# Patient Record
Sex: Male | Born: 1965 | Race: White | Hispanic: No | Marital: Single | Smoking: Never smoker
Health system: Southern US, Community
[De-identification: ages and names within clinical notes are randomized; demographics above are authoritative.]

## PROBLEM LIST (undated history)

## (undated) DIAGNOSIS — E039 Hypothyroidism, unspecified: Secondary | ICD-10-CM

## (undated) DIAGNOSIS — R030 Elevated blood-pressure reading, without diagnosis of hypertension: Secondary | ICD-10-CM

## (undated) DIAGNOSIS — E291 Testicular hypofunction: Secondary | ICD-10-CM

## (undated) HISTORY — PX: BREAST SURGERY: SHX581

## (undated) HISTORY — PX: COSMETIC SURGERY: SHX468

## (undated) HISTORY — PX: TONSILLECTOMY: SUR1361

## (undated) HISTORY — DX: Testicular hypofunction: E29.1

## (undated) HISTORY — DX: Elevated blood-pressure reading, without diagnosis of hypertension: R03.0

---

## 2002-09-04 ENCOUNTER — Emergency Department (HOSPITAL_COMMUNITY): Admission: EM | Admit: 2002-09-04 | Discharge: 2002-09-04 | Payer: Self-pay | Admitting: Emergency Medicine

## 2002-09-04 ENCOUNTER — Encounter: Payer: Self-pay | Admitting: Emergency Medicine

## 2007-04-21 ENCOUNTER — Ambulatory Visit (HOSPITAL_COMMUNITY): Admission: RE | Admit: 2007-04-21 | Discharge: 2007-04-21 | Payer: Self-pay | Admitting: Internal Medicine

## 2010-07-26 ENCOUNTER — Emergency Department (HOSPITAL_COMMUNITY)
Admission: EM | Admit: 2010-07-26 | Discharge: 2010-07-27 | Disposition: A | Discharge status: Home / Self Care | Payer: BC Managed Care – PPO | Attending: Emergency Medicine | Admitting: Emergency Medicine

## 2010-07-26 ENCOUNTER — Encounter: Payer: Self-pay | Admitting: *Deleted

## 2010-07-26 DIAGNOSIS — I10 Essential (primary) hypertension: Secondary | ICD-10-CM | POA: Insufficient documentation

## 2010-07-26 DIAGNOSIS — R21 Rash and other nonspecific skin eruption: Secondary | ICD-10-CM | POA: Insufficient documentation

## 2010-07-26 MED ORDER — DIPHENHYDRAMINE HCL 50 MG/ML IJ SOLN
50.0000 mg | Freq: Once | INTRAMUSCULAR | Status: AC
  Administered 2010-07-26: 50 mg via INTRAMUSCULAR
  Filled 2010-07-26: qty 1

## 2010-07-26 MED ORDER — DIPHENHYDRAMINE HCL 25 MG PO TABS: 50.0000 mg | ORAL_TABLET | Freq: Four times a day (QID) | ORAL | Status: AC

## 2010-07-26 NOTE — ED Provider Notes (Signed)
History     Chief Complaint  Patient presents with  . Rash   Patient is a 45 y.o. male presenting with rash.  Rash    Pt presents with c/o diffuse itchy rash.  Was seen at urgent care 2 days ago, started on steroid dose pack 2 days ago and has tried benadryl.  Rash has continued and remains itchy.  No lip/tongue swelling, no sob.  No known new exposures, except patient was pulling weeds before the rash began.  No new medications, no new foods, no new topical things such as detergent, soaps.  No hx of allergies, no fever Past Medical History  Diagnosis Date  . Hypertension     Past Surgical History  Procedure Date  . Tonsillectomy     No family history on file.  History  Substance Use Topics  . Smoking status: Never Smoker   . Smokeless tobacco: Not on file  . Alcohol Use: No      Review of Systems  Skin: Positive for rash.  10 Systems reviewed and are negative for acute change except as noted in the HPI.  Physical Exam  BP 157/107  Pulse 106  Temp(Src) 98.7 F (37.1 C) (Oral)  Resp 18  Ht 5\' 9"  (1.753 m)  Wt 210 lb (95.255 kg)  BMI 31.01 kg/m2  SpO2 98%  Physical Exam  Nursing note and vitals reviewed. Gen- awake, alert, NAD Eyes- PERRL, EOMI, normal appearance HEENT- MMM, OP clear, no lip/tongue swelling, palate symmetric Neck- supple, from Lungs- CTAB, breath sounds symmetric, no accessory muscle movement or dyspnea CV-RRR, no murmur/rub/gallop Abd- soft, nontender, nondistended Chest- nontender Ext- no peripheral edema, neurovascularly intact, 2+ dp pulses, brisk cap refill Skin- erythematous, hive appearance, blanching, over upper and lower extremities, chest abdomen, no involvement of face or eyes   ED Course  Procedures  MDM Pt with pruritic rash, no specific cause known.   Appears to be allergic in nature.  Contact dermatitis versus ingested allergen.  No fevers or other systemic symptoms to suggest infection.  Doubt drug rash as pt denies  taking any medications or any other OTC meds.   Pt advised to continue steroid as prescribed, also benadryl.  Given benadryl IM here to help with itching.       Ethelda Chick, MD 07/26/10 [phone]

## 2010-07-26 NOTE — ED Notes (Signed)
Patient with rash for couple of days, started off with couple of spots and now all over, seen at Urgent Care and prescribed with prednisone and without relief, c/o itching.

## 2010-07-26 NOTE — ED Notes (Signed)
Denies SOB.

## 2010-07-27 ENCOUNTER — Encounter (HOSPITAL_COMMUNITY): Payer: Self-pay | Admitting: *Deleted

## 2011-02-01 ENCOUNTER — Other Ambulatory Visit (HOSPITAL_COMMUNITY): Payer: Self-pay | Admitting: Internal Medicine

## 2011-02-01 ENCOUNTER — Ambulatory Visit (HOSPITAL_COMMUNITY)
Admission: RE | Admit: 2011-02-01 | Discharge: 2011-02-01 | Disposition: A | Discharge status: Home / Self Care | Payer: 59 | Source: Ambulatory Visit | Attending: Internal Medicine | Admitting: Internal Medicine

## 2011-02-01 DIAGNOSIS — M51379 Other intervertebral disc degeneration, lumbosacral region without mention of lumbar back pain or lower extremity pain: Secondary | ICD-10-CM | POA: Insufficient documentation

## 2011-02-01 DIAGNOSIS — M545 Low back pain, unspecified: Secondary | ICD-10-CM | POA: Insufficient documentation

## 2011-02-01 DIAGNOSIS — M8448XA Pathological fracture, other site, initial encounter for fracture: Secondary | ICD-10-CM | POA: Insufficient documentation

## 2011-02-01 DIAGNOSIS — M549 Dorsalgia, unspecified: Secondary | ICD-10-CM

## 2011-02-01 DIAGNOSIS — M5137 Other intervertebral disc degeneration, lumbosacral region: Secondary | ICD-10-CM | POA: Insufficient documentation

## 2014-05-07 ENCOUNTER — Other Ambulatory Visit (HOSPITAL_COMMUNITY): Payer: Self-pay | Admitting: Family Medicine

## 2014-05-07 DIAGNOSIS — E039 Hypothyroidism, unspecified: Secondary | ICD-10-CM

## 2014-05-11 ENCOUNTER — Ambulatory Visit (HOSPITAL_COMMUNITY)
Admission: RE | Admit: 2014-05-11 | Discharge: 2014-05-11 | Disposition: A | Discharge status: Home / Self Care | Payer: 59 | Source: Ambulatory Visit | Attending: Family Medicine | Admitting: Family Medicine

## 2014-05-11 DIAGNOSIS — E039 Hypothyroidism, unspecified: Secondary | ICD-10-CM | POA: Diagnosis not present

## 2014-08-24 ENCOUNTER — Other Ambulatory Visit (HOSPITAL_COMMUNITY): Payer: Self-pay | Admitting: Family Medicine

## 2014-08-24 DIAGNOSIS — M5410 Radiculopathy, site unspecified: Secondary | ICD-10-CM

## 2014-09-03 ENCOUNTER — Ambulatory Visit (HOSPITAL_COMMUNITY)
Admission: RE | Admit: 2014-09-03 | Discharge: 2014-09-03 | Disposition: A | Discharge status: Home / Self Care | Payer: 59 | Source: Ambulatory Visit | Attending: Family Medicine | Admitting: Family Medicine

## 2014-09-03 DIAGNOSIS — M5126 Other intervertebral disc displacement, lumbar region: Secondary | ICD-10-CM | POA: Diagnosis not present

## 2014-09-03 DIAGNOSIS — M5410 Radiculopathy, site unspecified: Secondary | ICD-10-CM

## 2014-09-03 DIAGNOSIS — M4806 Spinal stenosis, lumbar region: Secondary | ICD-10-CM | POA: Insufficient documentation

## 2014-09-03 DIAGNOSIS — M545 Low back pain: Secondary | ICD-10-CM | POA: Diagnosis not present

## 2014-10-07 ENCOUNTER — Other Ambulatory Visit: Payer: Self-pay | Admitting: Orthopedic Surgery

## 2014-10-07 DIAGNOSIS — M545 Low back pain, unspecified: Secondary | ICD-10-CM

## 2014-10-22 ENCOUNTER — Ambulatory Visit
Admission: RE | Admit: 2014-10-22 | Discharge: 2014-10-22 | Disposition: A | Discharge status: Home / Self Care | Payer: 59 | Source: Ambulatory Visit | Attending: Orthopedic Surgery | Admitting: Orthopedic Surgery

## 2014-10-22 DIAGNOSIS — M545 Low back pain, unspecified: Secondary | ICD-10-CM

## 2014-10-22 MED ORDER — DIAZEPAM 5 MG PO TABS
5.0000 mg | ORAL_TABLET | Freq: Once | ORAL | Status: AC
  Administered 2014-10-22: 5 mg via ORAL

## 2014-10-22 MED ORDER — IOHEXOL 180 MG/ML  SOLN
15.0000 mL | Freq: Once | INTRAMUSCULAR | Status: DC | PRN
  Administered 2014-10-22: 15 mL via INTRATHECAL

## 2014-10-22 NOTE — Discharge Instructions (Signed)

## 2014-11-02 ENCOUNTER — Other Ambulatory Visit: Payer: Self-pay | Admitting: Surgical

## 2014-11-08 NOTE — Patient Instructions (Signed)
Brian Guzman  11/08/2014   Your procedure is scheduled on:  11/17/14   Report to [facility] Main  Entrance take [place]  elevators to 3rd floor to  [place] at      [phone].  Call this number if you have problems the morning of surgery [phone]   Remember: ONLY 1 PERSON MAY GO WITH YOU TO SHORT STAY TO GET  READY MORNING OF Brian Guzman.  Do not eat food or drink liquids :After Midnight.     Take these medicines the morning of surgery with A SIP OF WATER: none                                 You may not have any metal on your body including hair pins and              piercings  Do not wear jewelry,, lotions, powders or perfumes, deodorant                       Men may shave face and neck.   Do not bring valuables to the hospital. [name].  Contacts, dentures or bridgework may not be worn into surgery.  Leave suitcase in the car. After surgery it may be brought to your room.       Special Instructions:  Coughing and deep breathing exercises, leg exercises               Please read over the following fact sheets you were given: _____________________________________________________________________             [facility] - Preparing for Surgery Before surgery, you can play an important role.  Because skin is not sterile, your skin needs to be as free of germs as possible.  You can reduce the number of germs on your skin by washing with CHG (chlorahexidine gluconate) soap before surgery.  CHG is an antiseptic cleaner which kills germs and bonds with the skin to continue killing germs even after washing. Please DO NOT use if you have an allergy to CHG or antibacterial soaps.  If your skin becomes reddened/irritated stop using the CHG and inform your nurse when you arrive at Short Stay. Do not shave (including legs and underarms) for at least 48 hours prior to the first CHG shower.  You may shave  your face/neck. Please follow these instructions carefully:  1.  Shower with CHG Soap the night before surgery and the  morning of Surgery.  2.  If you choose to wash your hair, wash your hair first as usual with your  normal  shampoo.  3.  After you shampoo, rinse your hair and body thoroughly to remove the  shampoo.                           4.  Use CHG as you would any other liquid soap.  You can apply chg directly  to the skin and wash                       Gently with a scrungie or clean washcloth.  5.  Apply the CHG  Soap to your body ONLY FROM THE NECK DOWN.   Do not use on face/ open                           Wound or open sores. Avoid contact with eyes, ears mouth and genitals (private parts).                       Wash face,  Genitals (private parts) with your normal soap.             6.  Wash thoroughly, paying special attention to the area where your surgery  will be performed.  7.  Thoroughly rinse your body with warm water from the neck down.  8.  DO NOT shower/wash with your normal soap after using and rinsing off  the CHG Soap.                9.  Pat yourself dry with a clean towel.            10.  Wear clean pajamas.            11.  Place clean sheets on your bed the night of your first shower and do not  sleep with pets. Day of Surgery : Do not apply any lotions/deodorants the morning of surgery.  Please wear clean clothes to the hospital/surgery center.  FAILURE TO FOLLOW THESE INSTRUCTIONS MAY RESULT IN THE CANCELLATION OF YOUR SURGERY PATIENT SIGNATURE_________________________________  NURSE SIGNATURE__________________________________  ________________________________________________________________________   Brian Guzman  An incentive spirometer is a tool that can help keep your lungs clear and active. This tool measures how well you are filling your lungs with each breath. Taking long deep breaths may help reverse or decrease the chance of developing  breathing (pulmonary) problems (especially infection) following:  A long period of time when you are unable to move or be active. BEFORE THE PROCEDURE   If the spirometer includes an indicator to show your best effort, your nurse or respiratory therapist will set it to a desired goal.  If possible, sit up straight or lean slightly forward. Try not to slouch.  Hold the incentive spirometer in an upright position. INSTRUCTIONS FOR USE  1. Sit on the edge of your bed if possible, or sit up as far as you can in bed or on a chair. 2. Hold the incentive spirometer in an upright position. 3. Breathe out normally. 4. Place the mouthpiece in your mouth and seal your lips tightly around it. 5. Breathe in slowly and as deeply as possible, raising the piston or the ball toward the top of the column. 6. Hold your breath for 3-5 seconds or for as long as possible. Allow the piston or ball to fall to the bottom of the column. 7. Remove the mouthpiece from your mouth and breathe out normally. 8. Rest for a few seconds and repeat Steps 1 through 7 at least 10 times every 1-2 hours when you are awake. Take your time and take a few normal breaths between deep breaths. 9. The spirometer may include an indicator to show your best effort. Use the indicator as a goal to work toward during each repetition. 10. After each set of 10 deep breaths, practice coughing to be sure your lungs are clear. If you have an incision (the cut made at the time of surgery), support your incision when coughing by placing a pillow or rolled up towels  firmly against it. Once you are able to get out of bed, walk around indoors and cough well. You may stop using the incentive spirometer when instructed by your caregiver.  RISKS AND COMPLICATIONS  Take your time so you do not get dizzy or light-headed.  If you are in pain, you may need to take or ask for pain medication before doing incentive spirometry. It is harder to take a deep  breath if you are having pain. AFTER USE  Rest and breathe slowly and easily.  It can be helpful to keep track of a log of your progress. Your caregiver can provide you with a simple table to help with this. If you are using the spirometer at home, follow these instructions: [place] IF:   You are having difficultly using the spirometer.  You have trouble using the spirometer as often as instructed.  Your pain medication is not giving enough relief while using the spirometer.  You develop fever of 100.5 F (38.1 C) or higher. SEEK IMMEDIATE MEDICAL CARE IF:   You cough up bloody sputum that had not been present before.  You develop fever of 102 F (38.9 C) or greater.  You develop worsening pain at or near the incision site. MAKE SURE YOU:   Understand these instructions.  Will watch your condition.  Will get help right away if you are not doing well or get worse. Document Released: 04/30/2006 Document Revised: 03/12/2011 Document Reviewed: 07/01/2006 [facility] Patient Information 2014 [place], [place].   ________________________________________________________________________

## 2014-11-10 ENCOUNTER — Ambulatory Visit (HOSPITAL_COMMUNITY)
Admission: RE | Admit: 2014-11-10 | Discharge: 2014-11-10 | Disposition: A | Discharge status: Home / Self Care | Payer: 59 | Source: Ambulatory Visit | Attending: Surgical | Admitting: Surgical

## 2014-11-10 ENCOUNTER — Encounter (HOSPITAL_COMMUNITY)
Admission: RE | Admit: 2014-11-10 | Discharge: 2014-11-10 | Disposition: A | Discharge status: Home / Self Care | Payer: 59 | Source: Ambulatory Visit | Attending: Orthopedic Surgery | Admitting: Orthopedic Surgery

## 2014-11-10 ENCOUNTER — Encounter (HOSPITAL_COMMUNITY): Payer: Self-pay

## 2014-11-10 DIAGNOSIS — M2578 Osteophyte, vertebrae: Secondary | ICD-10-CM | POA: Diagnosis not present

## 2014-11-10 DIAGNOSIS — Z01812 Encounter for preprocedural laboratory examination: Secondary | ICD-10-CM | POA: Diagnosis not present

## 2014-11-10 DIAGNOSIS — M5136 Other intervertebral disc degeneration, lumbar region: Secondary | ICD-10-CM | POA: Insufficient documentation

## 2014-11-10 DIAGNOSIS — Z01818 Encounter for other preprocedural examination: Secondary | ICD-10-CM | POA: Diagnosis present

## 2014-11-10 HISTORY — DX: Hypothyroidism, unspecified: E03.9

## 2014-11-10 LAB — COMPREHENSIVE METABOLIC PANEL
ALT: 39 U/L (ref 17–63)
AST: 24 U/L (ref 15–41)
Albumin: 4.5 g/dL (ref 3.5–5.0)
Alkaline Phosphatase: 63 U/L (ref 38–126)
Anion gap: 7 (ref 5–15)
BUN: 16 mg/dL (ref 6–20)
CO2: 27 mmol/L (ref 22–32)
Calcium: 9 mg/dL (ref 8.9–10.3)
Chloride: 103 mmol/L (ref 101–111)
Creatinine, Ser: 0.88 mg/dL (ref 0.61–1.24)
GFR calc Af Amer: >60 mL/min (ref >60)
GFR calc non Af Amer: >60 mL/min (ref >60)
Glucose, Bld: 97 mg/dL (ref 65–99)
Potassium: 3.7 mmol/L (ref 3.5–5.1)
Sodium: 137 mmol/L (ref 135–145)
Total Bilirubin: 0.9 mg/dL (ref 0.3–1.2)
Total Protein: 8 g/dL (ref 6.5–8.1)

## 2014-11-10 LAB — CBC WITH DIFFERENTIAL/PLATELET
Basophils Absolute: 0.1 10*3/uL (ref 0.0–0.1)
Basophils Relative: 1 %
Eosinophils Absolute: 0.3 10*3/uL (ref 0.0–0.7)
Eosinophils Relative: 4 %
HCT: 44.7 % (ref 39.0–52.0)
Hemoglobin: 15.6 g/dL (ref 13.0–17.0)
Lymphocytes Relative: 30 %
Lymphs Abs: 2.1 10*3/uL (ref 0.7–4.0)
MCH: 32 pg (ref 26.0–34.0)
MCHC: 34.9 g/dL (ref 30.0–36.0)
MCV: 91.6 fL (ref 78.0–100.0)
Monocytes Absolute: 0.9 10*3/uL (ref 0.1–1.0)
Monocytes Relative: 12 %
Neutro Abs: 3.8 10*3/uL (ref 1.7–7.7)
Neutrophils Relative %: 53 %
Platelets: 230 10*3/uL (ref 150–400)
RBC: 4.88 MIL/uL (ref 4.22–5.81)
RDW: 12.5 % (ref 11.5–15.5)
WBC: 7.2 10*3/uL (ref 4.0–10.5)

## 2014-11-10 LAB — PROTIME-INR
INR: 1.04 (ref 0.00–1.49)
Prothrombin Time: 13.8 seconds (ref 11.6–15.2)

## 2014-11-10 LAB — APTT: aPTT: 29 seconds (ref 24–37)

## 2014-11-10 LAB — SURGICAL PCR SCREEN
MRSA, PCR: NEGATIVE
Staphylococcus aureus: NEGATIVE

## 2014-11-15 NOTE — Progress Notes (Signed)
Final EKG in EPIC done 11/10/14.

## 2014-11-16 NOTE — H&P (Signed)
Brian Guzman is an 49 y.o. male.   Chief Complaint: low back pain HPI: The patient presented with the chief complaint of low back pain. He reports that he has a history of low back pain, but he has had increased pain in the past 6 months without any known injury. He has pain in both legs, right greater than left. No change in bladder or bowel function. He has been taking Meloxicam which gives him mild relief. He has pain at all times, even at rest. MRI showed bilateral foraminal disc herniation at L4-L5. CT myelogram showed significant stenosis at L4-L5.   Past Medical History  Diagnosis Date  . Hypertension   . Hypothyroidism     Past Surgical History  Procedure Laterality Date  . Tonsillectomy      Social History:  reports that he has never smoked. He has never used smokeless tobacco. He reports that he does not drink alcohol or use illicit drugs.  Allergies: No Known Allergies    Current outpatient prescriptions:  .  levothyroxine (SYNTHROID, LEVOTHROID) 50 MCG tablet, Take 50 mcg by mouth every evening., Disp: , Rfl: 3 .  losartan-hydrochlorothiazide (HYZAAR) 100-25 MG tablet, Take 1 tablet by mouth every evening. , Disp: , Rfl: 3 .  meloxicam (MOBIC) 7.5 MG tablet, Take 7.5 mg by mouth every evening. , Disp: , Rfl: 3   Review of Systems  Constitutional: Negative.   HENT: Negative.   Eyes: Negative.   Respiratory: Negative.   Cardiovascular: Negative.   Gastrointestinal: Negative.   Genitourinary: Negative.   Musculoskeletal: Positive for myalgias, back pain and joint pain. Negative for falls and neck pain.  Skin: Negative.   Neurological: Negative.   Endo/Heme/Allergies: Negative.   Psychiatric/Behavioral: Negative.     Vitals  Weight: 214 lb Height: 69in Body Surface Area: 2.13 m Body Mass Index: 31.6 kg/m  BP: 112/78 (Sitting, Left Arm, Standard) HR: 76 bpm  Physical Exam  Constitutional: He is oriented to person, place, and time. He appears  well-developed. No distress.  Overweight  HENT:  Head: Normocephalic and atraumatic.  Right Ear: External ear normal.  Left Ear: External ear normal.  Nose: Nose normal.  Mouth/Throat: Oropharynx is clear and moist.  Eyes: Conjunctivae and EOM are normal.  Neck: Normal range of motion. Neck supple.  Cardiovascular: Normal rate, regular rhythm, normal heart sounds and intact distal pulses.   No murmur heard. Respiratory: Effort normal and breath sounds normal. No respiratory distress. He has no wheezes.  GI: Soft. Bowel sounds are normal. He exhibits no distension. There is no tenderness.  Musculoskeletal:       Right hip: Normal.       Left hip: Normal.       Right knee: Normal.       Left knee: Normal.       Lumbar back: He exhibits pain and spasm.  Neurological: He is alert and oriented to person, place, and time. He has normal reflexes. No sensory deficit.  He has marked weakness of the dorsiflexors of his right foot.  Skin: No rash noted. He is not diaphoretic. No erythema.  Psychiatric: He has a normal mood and affect.     Assessment/Plan Lumbar spinal stenosis Lumbar disc herniation L4-L5 right He needs a central decompressive lumbar laminectomy and microdiscectomy L4-L5 right. Risks and benefits of the procedure discussed with the patient by Dr. Gladstone Lighter. The possible complications of spinal surgery number one could be infection, which is extremely rare. We do use antibiotics  prior to the surgery and during surgery and after surgery. Number two is always a slight degree of probability that you could develop a blood clot in your leg after any type of surgery and we try our best to prevent that with aspirin post op when it is safe to begin. The third is a dural leak. That is the spinal fluid leak that could occur. At certain rare times the bone or the disc could literally stick to the dura which is the lining which contains the spinal fluid and we could develop a small tear in that  lining which we then patch up. That is an extremely rare complication. The last and final complication is a recurrent disc rupture. That means that you could rupture another small piece of disc later on down the road and there is about a 2% chance of that.    H&P performed by Dr. Gladstone Lighter Documented by Ardeen Jourdain, PA-C  [place], Ryhanna [place]uns[place] Ander Purpura 11/16/2014, 8:38 AM

## 2014-11-17 ENCOUNTER — Ambulatory Visit (HOSPITAL_COMMUNITY): Payer: 59

## 2014-11-17 ENCOUNTER — Observation Stay (HOSPITAL_COMMUNITY)
Admission: RE | Admit: 2014-11-17 | Discharge: 2014-11-18 | Disposition: A | Discharge status: Home / Self Care | Payer: 59 | Source: Ambulatory Visit | Attending: Orthopedic Surgery | Admitting: Orthopedic Surgery

## 2014-11-17 ENCOUNTER — Ambulatory Visit (HOSPITAL_COMMUNITY): Payer: 59 | Admitting: Anesthesiology

## 2014-11-17 ENCOUNTER — Encounter (HOSPITAL_COMMUNITY): Payer: Self-pay | Admitting: *Deleted

## 2014-11-17 ENCOUNTER — Encounter (HOSPITAL_COMMUNITY)
Admission: RE | Disposition: A | Discharge status: Home / Self Care | Payer: Self-pay | Source: Ambulatory Visit | Attending: Orthopedic Surgery

## 2014-11-17 DIAGNOSIS — E669 Obesity, unspecified: Secondary | ICD-10-CM | POA: Insufficient documentation

## 2014-11-17 DIAGNOSIS — M4806 Spinal stenosis, lumbar region: Secondary | ICD-10-CM | POA: Insufficient documentation

## 2014-11-17 DIAGNOSIS — M5126 Other intervertebral disc displacement, lumbar region: Secondary | ICD-10-CM | POA: Diagnosis present

## 2014-11-17 DIAGNOSIS — Z6832 Body mass index (BMI) 32.0-32.9, adult: Secondary | ICD-10-CM | POA: Diagnosis not present

## 2014-11-17 DIAGNOSIS — Z79899 Other long term (current) drug therapy: Secondary | ICD-10-CM | POA: Insufficient documentation

## 2014-11-17 DIAGNOSIS — Z419 Encounter for procedure for purposes other than remedying health state, unspecified: Secondary | ICD-10-CM

## 2014-11-17 DIAGNOSIS — M48062 Spinal stenosis, lumbar region with neurogenic claudication: Secondary | ICD-10-CM | POA: Diagnosis present

## 2014-11-17 DIAGNOSIS — I10 Essential (primary) hypertension: Secondary | ICD-10-CM | POA: Diagnosis not present

## 2014-11-17 DIAGNOSIS — E039 Hypothyroidism, unspecified: Secondary | ICD-10-CM | POA: Diagnosis not present

## 2014-11-17 HISTORY — PX: LUMBAR LAMINECTOMY/DECOMPRESSION MICRODISCECTOMY: SHX5026

## 2014-11-17 SURGERY — LUMBAR LAMINECTOMY/DECOMPRESSION MICRODISCECTOMY 1 LEVEL
Anesthesia: General | Site: Back | Laterality: Right

## 2014-11-17 MED ORDER — PHENOL 1.4 % MT LIQD: 1.0000 | OROMUCOSAL | Status: DC | PRN

## 2014-11-17 MED ORDER — THROMBIN 5000 UNITS EX SOLR
CUTANEOUS | Status: DC | PRN
  Administered 2014-11-17: 10 mL via TOPICAL

## 2014-11-17 MED ORDER — ACETAMINOPHEN 650 MG RE SUPP: 650.0000 mg | RECTAL | Status: DC | PRN

## 2014-11-17 MED ORDER — SUGAMMADEX SODIUM 500 MG/5ML IV SOLN
INTRAVENOUS | Status: AC
  Filled 2014-11-17: qty 5

## 2014-11-17 MED ORDER — PROPOFOL 10 MG/ML IV BOLUS
INTRAVENOUS | Status: AC
  Filled 2014-11-17: qty 20

## 2014-11-17 MED ORDER — ROCURONIUM BROMIDE 100 MG/10ML IV SOLN
INTRAVENOUS | Status: DC | PRN
  Administered 2014-11-17: 10 mg via INTRAVENOUS
  Administered 2014-11-17: 50 mg via INTRAVENOUS
  Administered 2014-11-17 (×2): 10 mg via INTRAVENOUS

## 2014-11-17 MED ORDER — BACITRACIN-NEOMYCIN-POLYMYXIN 400-5-5000 EX OINT
TOPICAL_OINTMENT | CUTANEOUS | Status: AC
  Filled 2014-11-17: qty 1

## 2014-11-17 MED ORDER — METHOCARBAMOL 500 MG PO TABS
500.0000 mg | ORAL_TABLET | Freq: Four times a day (QID) | ORAL | Status: DC | PRN
  Administered 2014-11-17: 500 mg via ORAL
  Filled 2014-11-17 (×2): qty 1

## 2014-11-17 MED ORDER — BUPIVACAINE LIPOSOME 1.3 % IJ SUSP
20.0000 mL | Freq: Once | INTRAMUSCULAR | Status: AC
  Administered 2014-11-17: 20 mL
  Filled 2014-11-17: qty 20

## 2014-11-17 MED ORDER — ONDANSETRON HCL 4 MG/2ML IJ SOLN
INTRAMUSCULAR | Status: AC
  Filled 2014-11-17: qty 2

## 2014-11-17 MED ORDER — HYDROCHLOROTHIAZIDE 25 MG PO TABS
25.0000 mg | ORAL_TABLET | Freq: Every day | ORAL | Status: DC
  Administered 2014-11-18: 25 mg via ORAL
  Filled 2014-11-17 (×2): qty 1

## 2014-11-17 MED ORDER — MIDAZOLAM HCL 2 MG/2ML IJ SOLN
INTRAMUSCULAR | Status: AC
  Filled 2014-11-17: qty 2

## 2014-11-17 MED ORDER — CHLORHEXIDINE GLUCONATE 4 % EX LIQD: 60.0000 mL | Freq: Once | CUTANEOUS | Status: DC

## 2014-11-17 MED ORDER — HYDROMORPHONE HCL 1 MG/ML IJ SOLN: 0.5000 mg | INTRAMUSCULAR | Status: DC | PRN

## 2014-11-17 MED ORDER — DEXAMETHASONE SODIUM PHOSPHATE 10 MG/ML IJ SOLN
INTRAMUSCULAR | Status: AC
  Filled 2014-11-17: qty 1

## 2014-11-17 MED ORDER — BISACODYL 5 MG PO TBEC: 5.0000 mg | DELAYED_RELEASE_TABLET | Freq: Every day | ORAL | Status: DC | PRN

## 2014-11-17 MED ORDER — BACITRACIN ZINC 500 UNIT/GM EX OINT
TOPICAL_OINTMENT | CUTANEOUS | Status: AC
  Filled 2014-11-17: qty 28.35

## 2014-11-17 MED ORDER — CEFAZOLIN SODIUM-DEXTROSE 2-3 GM-% IV SOLR
INTRAVENOUS | Status: AC
  Filled 2014-11-17: qty 50

## 2014-11-17 MED ORDER — MIDAZOLAM HCL 5 MG/5ML IJ SOLN
INTRAMUSCULAR | Status: DC | PRN
  Administered 2014-11-17: 2 mg via INTRAVENOUS

## 2014-11-17 MED ORDER — LACTATED RINGERS IV SOLN
INTRAVENOUS | Status: DC
  Administered 2014-11-17: 20 mL/h via INTRAVENOUS

## 2014-11-17 MED ORDER — BACITRACIN-NEOMYCIN-POLYMYXIN 400-5-5000 EX OINT
TOPICAL_OINTMENT | CUTANEOUS | Status: DC | PRN
  Administered 2014-11-17: 1 via TOPICAL

## 2014-11-17 MED ORDER — SODIUM CHLORIDE 0.9 % IR SOLN
Status: DC | PRN
  Administered 2014-11-17: 500 mL

## 2014-11-17 MED ORDER — LABETALOL HCL 5 MG/ML IV SOLN
INTRAVENOUS | Status: DC | PRN
  Administered 2014-11-17 (×4): 2.5 mg via INTRAVENOUS

## 2014-11-17 MED ORDER — HYDROMORPHONE HCL 1 MG/ML IJ SOLN
0.2500 mg | INTRAMUSCULAR | Status: DC | PRN
  Administered 2014-11-17 (×2): 0.5 mg via INTRAVENOUS

## 2014-11-17 MED ORDER — POLYETHYLENE GLYCOL 3350 17 G PO PACK: 17.0000 g | PACK | Freq: Every day | ORAL | Status: DC | PRN

## 2014-11-17 MED ORDER — PROMETHAZINE HCL 25 MG/ML IJ SOLN: 6.2500 mg | INTRAMUSCULAR | Status: DC | PRN

## 2014-11-17 MED ORDER — LACTATED RINGERS IV SOLN
INTRAVENOUS | Status: DC
  Administered 2014-11-17 (×2): via INTRAVENOUS

## 2014-11-17 MED ORDER — LIDOCAINE HCL (CARDIAC) 20 MG/ML IV SOLN
INTRAVENOUS | Status: DC | PRN
  Administered 2014-11-17: 50 mg via INTRAVENOUS

## 2014-11-17 MED ORDER — LABETALOL HCL 5 MG/ML IV SOLN
INTRAVENOUS | Status: AC
  Filled 2014-11-17: qty 4

## 2014-11-17 MED ORDER — HYDROMORPHONE HCL 1 MG/ML IJ SOLN
INTRAMUSCULAR | Status: DC | PRN
  Administered 2014-11-17: 0.5 mg via INTRAVENOUS
  Administered 2014-11-17: 1 mg via INTRAVENOUS
  Administered 2014-11-17: 0.5 mg via INTRAVENOUS

## 2014-11-17 MED ORDER — OXYCODONE-ACETAMINOPHEN 5-325 MG PO TABS
1.0000 | ORAL_TABLET | ORAL | Status: DC | PRN
  Administered 2014-11-17: 1 via ORAL
  Administered 2014-11-18 (×3): 2 via ORAL
  Filled 2014-11-17: qty 2
  Filled 2014-11-17: qty 1
  Filled 2014-11-17 (×2): qty 2

## 2014-11-17 MED ORDER — HYDROCODONE-ACETAMINOPHEN 5-325 MG PO TABS
1.0000 | ORAL_TABLET | ORAL | Status: DC | PRN
  Filled 2014-11-17: qty 1

## 2014-11-17 MED ORDER — ACETAMINOPHEN 325 MG PO TABS: 650.0000 mg | ORAL_TABLET | ORAL | Status: DC | PRN

## 2014-11-17 MED ORDER — SUGAMMADEX SODIUM 500 MG/5ML IV SOLN
INTRAVENOUS | Status: DC | PRN
  Administered 2014-11-17: 500 mg via INTRAVENOUS

## 2014-11-17 MED ORDER — FENTANYL CITRATE (PF) 250 MCG/5ML IJ SOLN
INTRAMUSCULAR | Status: AC
  Filled 2014-11-17: qty 5

## 2014-11-17 MED ORDER — METHOCARBAMOL 1000 MG/10ML IJ SOLN
500.0000 mg | Freq: Four times a day (QID) | INTRAVENOUS | Status: DC | PRN
  Administered 2014-11-17: 500 mg via INTRAVENOUS
  Filled 2014-11-17 (×2): qty 5

## 2014-11-17 MED ORDER — BUPIVACAINE-EPINEPHRINE 0.5% -1:200000 IJ SOLN
INTRAMUSCULAR | Status: DC | PRN
  Administered 2014-11-17: 20 mL

## 2014-11-17 MED ORDER — LOSARTAN POTASSIUM-HCTZ 100-25 MG PO TABS: 1.0000 | ORAL_TABLET | Freq: Every evening | ORAL | Status: DC

## 2014-11-17 MED ORDER — SODIUM CHLORIDE 0.9 % IR SOLN
Status: AC
  Filled 2014-11-17: qty 1

## 2014-11-17 MED ORDER — FENTANYL CITRATE (PF) 100 MCG/2ML IJ SOLN
INTRAMUSCULAR | Status: DC | PRN
  Administered 2014-11-17: 50 ug via INTRAVENOUS
  Administered 2014-11-17 (×2): 100 ug via INTRAVENOUS

## 2014-11-17 MED ORDER — ONDANSETRON HCL 4 MG/2ML IJ SOLN: 4.0000 mg | INTRAMUSCULAR | Status: DC | PRN

## 2014-11-17 MED ORDER — THROMBIN 5000 UNITS EX SOLR
CUTANEOUS | Status: AC
  Filled 2014-11-17: qty 5000

## 2014-11-17 MED ORDER — FLEET ENEMA 7-19 GM/118ML RE ENEM: 1.0000 | ENEMA | Freq: Once | RECTAL | Status: DC | PRN

## 2014-11-17 MED ORDER — HYDROMORPHONE HCL 2 MG/ML IJ SOLN
INTRAMUSCULAR | Status: AC
  Filled 2014-11-17: qty 1

## 2014-11-17 MED ORDER — ROCURONIUM BROMIDE 100 MG/10ML IV SOLN
INTRAVENOUS | Status: AC
  Filled 2014-11-17: qty 2

## 2014-11-17 MED ORDER — BUPIVACAINE-EPINEPHRINE (PF) 0.5% -1:200000 IJ SOLN
INTRAMUSCULAR | Status: AC
  Filled 2014-11-17: qty 30

## 2014-11-17 MED ORDER — PROPOFOL 10 MG/ML IV BOLUS
INTRAVENOUS | Status: AC
  Filled 2014-11-17: qty 40

## 2014-11-17 MED ORDER — LIDOCAINE HCL (CARDIAC) 20 MG/ML IV SOLN
INTRAVENOUS | Status: AC
  Filled 2014-11-17: qty 5

## 2014-11-17 MED ORDER — DEXAMETHASONE SODIUM PHOSPHATE 10 MG/ML IJ SOLN
INTRAMUSCULAR | Status: DC | PRN
  Administered 2014-11-17: 10 mg via INTRAVENOUS

## 2014-11-17 MED ORDER — CEFAZOLIN SODIUM-DEXTROSE 2-3 GM-% IV SOLR
2.0000 g | INTRAVENOUS | Status: AC
  Administered 2014-11-17: 2 g via INTRAVENOUS

## 2014-11-17 MED ORDER — LOSARTAN POTASSIUM 50 MG PO TABS
100.0000 mg | ORAL_TABLET | Freq: Every day | ORAL | Status: DC
  Administered 2014-11-18: 100 mg via ORAL
  Filled 2014-11-17 (×2): qty 2

## 2014-11-17 MED ORDER — CEFAZOLIN SODIUM 1-5 GM-% IV SOLN
1.0000 g | Freq: Three times a day (TID) | INTRAVENOUS | Status: AC
  Administered 2014-11-17 – 2014-11-18 (×3): 1 g via INTRAVENOUS
  Filled 2014-11-17 (×3): qty 50

## 2014-11-17 MED ORDER — ONDANSETRON HCL 4 MG/2ML IJ SOLN
INTRAMUSCULAR | Status: DC | PRN
  Administered 2014-11-17: 4 mg via INTRAVENOUS

## 2014-11-17 MED ORDER — PROPOFOL 10 MG/ML IV BOLUS
INTRAVENOUS | Status: DC | PRN
  Administered 2014-11-17: 200 mg via INTRAVENOUS

## 2014-11-17 MED ORDER — HYDROMORPHONE HCL 1 MG/ML IJ SOLN
INTRAMUSCULAR | Status: AC
  Filled 2014-11-17: qty 1

## 2014-11-17 MED ORDER — MENTHOL 3 MG MT LOZG: 1.0000 | LOZENGE | OROMUCOSAL | Status: DC | PRN

## 2014-11-17 MED ORDER — LEVOTHYROXINE SODIUM 50 MCG PO TABS
50.0000 ug | ORAL_TABLET | Freq: Every day | ORAL | Status: DC
  Administered 2014-11-17: 50 ug via ORAL
  Filled 2014-11-17 (×2): qty 1

## 2014-11-17 SURGICAL SUPPLY — 38 items
BAG ZIPLOCK 12X15 (MISCELLANEOUS) ×2 IMPLANT
CLEANER TIP ELECTROSURG 2X2 (MISCELLANEOUS) ×2 IMPLANT
DRAPE MICROSCOPE LEICA (MISCELLANEOUS) ×2 IMPLANT
DRAPE POUCH INSTRU U-SHP 10X18 (DRAPES) ×2 IMPLANT
DRAPE SHEET LG 3/4 BI-LAMINATE (DRAPES) ×2 IMPLANT
DRAPE SURG 17X11 SM STRL (DRAPES) IMPLANT
DRSG ADAPTIC 3X8 NADH LF (GAUZE/BANDAGES/DRESSINGS) ×2 IMPLANT
DRSG PAD ABDOMINAL 8X10 ST (GAUZE/BANDAGES/DRESSINGS) IMPLANT
DURAPREP 26ML APPLICATOR (WOUND CARE) ×2 IMPLANT
ELECT BLADE TIP CTD 4 INCH (ELECTRODE) ×2 IMPLANT
ELECT REM PT RETURN 9FT ADLT (ELECTROSURGICAL) ×2
ELECTRODE REM PT RTRN 9FT ADLT (ELECTROSURGICAL) ×1 IMPLANT
GAUZE SPONGE 4X4 12PLY STRL (GAUZE/BANDAGES/DRESSINGS) ×2 IMPLANT
GLOVE BIOGEL PI IND STRL 8 (GLOVE) ×1 IMPLANT
GLOVE BIOGEL PI INDICATOR 8 (GLOVE) ×1
GLOVE ECLIPSE 8.0 STRL XLNG CF (GLOVE) ×2 IMPLANT
GOWN STRL REUS W/TWL XL LVL3 (GOWN DISPOSABLE) ×4 IMPLANT
KIT BASIN OR (CUSTOM PROCEDURE TRAY) ×2 IMPLANT
KIT POSITIONING SURG ANDREWS (MISCELLANEOUS) ×2 IMPLANT
MANIFOLD NEPTUNE II (INSTRUMENTS) ×2 IMPLANT
NEEDLE HYPO 22GX1.5 SAFETY (NEEDLE) ×2 IMPLANT
NEEDLE SPNL 18GX3.5 QUINCKE PK (NEEDLE) ×4 IMPLANT
PACK LAMINECTOMY ORTHO (CUSTOM PROCEDURE TRAY) ×2 IMPLANT
PAD ABD 8X10 STRL (GAUZE/BANDAGES/DRESSINGS) ×2 IMPLANT
PATTIES SURGICAL .5 X.5 (GAUZE/BANDAGES/DRESSINGS) ×2 IMPLANT
PATTIES SURGICAL .75X.75 (GAUZE/BANDAGES/DRESSINGS) IMPLANT
PATTIES SURGICAL 1X1 (DISPOSABLE) IMPLANT
PEN SKIN MARKING BROAD (MISCELLANEOUS) ×2 IMPLANT
SPONGE LAP 4X18 X RAY DECT (DISPOSABLE) IMPLANT
SPONGE SURGIFOAM ABS GEL 100 (HEMOSTASIS) ×2 IMPLANT
STAPLER VISISTAT 35W (STAPLE) ×2 IMPLANT
SUT VIC AB 0 CT1 27 (SUTURE) ×1
SUT VIC AB 0 CT1 27XBRD ANTBC (SUTURE) ×1 IMPLANT
SUT VIC AB 1 CT1 27 (SUTURE) ×3
SUT VIC AB 1 CT1 27XBRD ANTBC (SUTURE) ×3 IMPLANT
SYR 20CC LL (SYRINGE) ×2 IMPLANT
TAPE CLOTH SURG 6X10 WHT LF (GAUZE/BANDAGES/DRESSINGS) ×2 IMPLANT
TOWEL OR 17X26 10 PK STRL BLUE (TOWEL DISPOSABLE) ×2 IMPLANT

## 2014-11-17 NOTE — Anesthesia Procedure Notes (Signed)
Procedure Name: Intubation Date/Time: 11/17/2014 11:32 AM Performed by: Adrain Nesbit, Virgel Gess Pre-anesthesia Checklist: Patient identified, Emergency Drugs available, Suction available, Patient being monitored and Timeout performed Patient Re-evaluated:Patient Re-evaluated prior to inductionOxygen Delivery Method: Circle system utilized Preoxygenation: Pre-oxygenation with 100% oxygen Intubation Type: IV induction Ventilation: Mask ventilation without difficulty Laryngoscope Size: Mac and 4 Grade View: Grade II Tube type: Oral Tube size: 7.5 mm Number of attempts: 1 Airway Equipment and Method: Stylet Placement Confirmation: ETT inserted through vocal cords under direct vision,  positive ETCO2,  CO2 detector and breath sounds checked- equal and bilateral Secured at: 22 cm Tube secured with: Tape Dental Injury: Teeth and Oropharynx as per pre-operative assessment

## 2014-11-17 NOTE — Interval H&P Note (Signed)
History and Physical Interval Note:  11/17/2014 10:53 AM  Brian Guzman  has presented today for surgery, with the diagnosis of SPINAL STENOSIS  The various methods of treatment have been discussed with the patient and family. After consideration of risks, benefits and other options for treatment, the patient has consented to  Procedure(s): LUMBAR LAMINECTOMY/DECOMPRESSION MICRODISCECTOMY 1 LEVEL L4-5 ON RIGHT (Right) as a surgical intervention .  The patient's history has been reviewed, patient examined, no change in status, stable for surgery.  I have reviewed the patient's chart and labs.  Questions were answered to the patient's satisfaction.     Kischa Altice A

## 2014-11-17 NOTE — Brief Op Note (Signed)
11/17/2014  1:41 PM  PATIENT:  Brian Guzman  49 y.o. male  PRE-OPERATIVE DIAGNOSIS:  SPINAL STENOSIS at L-4-L-5 and HNP at L-4-L-5 on the Right and partial Foot Drop on the right.  POST-OPERATIVE DIAGNOSIS:  SPINAL STENOSIS L4-L5, HERNIATED DISC L4-L5 ON THE RIGHT, PARTIAL FOOT DROP ON THE RIGHT  PROCEDURE:  Procedure(s): CENTRAL DECOMPRESSIVE LUMBAR LAMINECTOMY AT L4-L5.  MICRODISCECTOMY AT L4-L5 ON RIGHT.  FORAMINOTOMY FOR THE L4 AND L5 NERVE ROOT ON THE RIGHT. (Right)  SURGEON:  Surgeon(s) and Role:    * Latanya Maudlin, MD - Primary    * Magnus Sinning, MD - Assisting  :   ASSISTANTS: Tarri Glenn MD  ANESTHESIA:   general  EBL:  Total I/O In: 1000 [I.V.:1000] Out: 100 [Blood:100]  BLOOD ADMINISTERED:none  DRAINS: none   LOCAL MEDICATIONS USED:  MARCAINE 20cc of 0.50% with Epinephrine at the Start of the Case and 20cc of Exparel at the end of the case    SPECIMEN:  Source of Specimen:  L-4-L-5 on the Right.  DISPOSITION OF SPECIMEN:  PATHOLOGY  COUNTS:  YES  TOURNIQUET:  * No tourniquets in log *  DICTATION: .Other Dictation: Dictation Number [id number]  PLAN OF CARE: Admit for overnight observation  PATIENT DISPOSITION:  Stable in OR   Delay start of Pharmacological VTE agent (>24hrs) due to surgical blood loss or risk of bleeding: yes

## 2014-11-17 NOTE — Anesthesia Postprocedure Evaluation (Signed)
  Anesthesia Post-op Note  Patient: Brian Guzman  Procedure(s) Performed: Procedure(s) (LRB): CENTRAL DECOMPRESSIVE LUMBAR LAMINECTOMY AT L4-L5.  MICRODISCECTOMY AT L4-L5 ON RIGHT.  FORAMINOTOMY FOR THE L4 AND L5 NERVE ROOT ON THE RIGHT. (Right)  Patient Location: PACU  Anesthesia Type: General  Level of Consciousness: awake and alert   Airway and Oxygen Therapy: Patient Spontanous Breathing  Post-op Pain: mild  Post-op Assessment: Post-op Vital signs reviewed, Patient's Cardiovascular Status Stable, Respiratory Function Stable, Patent Airway and No signs of Nausea or vomiting  Last Vitals:  Filed Vitals:   11/17/14 1709  BP: 116/73  Pulse: 109  Temp: 36.6 C  Resp: 18    Post-op Vital Signs: stable   Complications: No apparent anesthesia complications

## 2014-11-17 NOTE — Transfer of Care (Signed)
Immediate Anesthesia Transfer of Care Note  Patient: Brian Guzman  Procedure(s) Performed: Procedure(s): CENTRAL DECOMPRESSIVE LUMBAR LAMINECTOMY AT L4-L5.  MICRODISCECTOMY AT L4-L5 ON RIGHT.  FORAMINOTOMY FOR THE L4 AND L5 NERVE ROOT ON THE RIGHT. (Right)  Patient Location: PACU  Anesthesia Type:General  Level of Consciousness:  sedated, patient cooperative and responds to stimulation  Airway & Oxygen Therapy:Patient Spontanous Breathing and Patient connected to face mask oxgen  Post-op Assessment:  Report given to PACU RN and Post -op Vital signs reviewed and stable  Post vital signs:  Reviewed and stable  Last Vitals:  Filed Vitals:   11/17/14 0854  BP: 126/91  Pulse: 102  Temp: 36.9 C  Resp: 18    Complications: No apparent anesthesia complications

## 2014-11-17 NOTE — Anesthesia Preprocedure Evaluation (Signed)
Anesthesia Evaluation  Patient identified by MRN, date of birth, ID band Patient awake    Reviewed: Allergy & Precautions, NPO status , Patient's Chart, lab work & pertinent test results  Airway Mallampati: II  TM Distance: >3 FB Neck ROM: Full    Dental no notable dental hx.    Pulmonary neg pulmonary ROS,    Pulmonary exam normal breath sounds clear to auscultation       Cardiovascular Exercise Tolerance: Good hypertension, Pt. on medications Normal cardiovascular exam Rhythm:Regular Rate:Normal     Neuro/Psych negative neurological ROS  negative psych ROS   GI/Hepatic negative GI ROS, Neg liver ROS,   Endo/Other  Hypothyroidism   Renal/GU negative Renal ROS  negative genitourinary   Musculoskeletal negative musculoskeletal ROS (+)   Abdominal (+) + obese,   Peds negative pediatric ROS (+)  Hematology negative hematology ROS (+)   Anesthesia Other Findings   Reproductive/Obstetrics negative OB ROS                             Anesthesia Physical Anesthesia Plan  ASA: II  Anesthesia Plan: General   Post-op Pain Management:    Induction: Intravenous  Airway Management Planned: Oral ETT  Additional Equipment:   Intra-op Plan:   Post-operative Plan: Extubation in OR  Informed Consent: I have reviewed the patients History and Physical, chart, labs and discussed the procedure including the risks, benefits and alternatives for the proposed anesthesia with the patient or authorized representative who has indicated his/her understanding and acceptance.   Dental advisory given  Plan Discussed with: CRNA  Anesthesia Plan Comments:         Anesthesia Quick Evaluation

## 2014-11-18 DIAGNOSIS — M5126 Other intervertebral disc displacement, lumbar region: Secondary | ICD-10-CM | POA: Diagnosis not present

## 2014-11-18 MED ORDER — OXYCODONE-ACETAMINOPHEN 5-325 MG PO TABS: 1.0000 | ORAL_TABLET | ORAL | Status: DC | PRN

## 2014-11-18 MED ORDER — METHOCARBAMOL 500 MG PO TABS: 500.0000 mg | ORAL_TABLET | Freq: Four times a day (QID) | ORAL | Status: DC | PRN

## 2014-11-18 NOTE — Discharge Instructions (Addendum)
For the first three days, remove your dressing, tape a piece of saran wrap over your incision.  Take your shower, then remove the saran wrap and put a clean dressing on. After three days you can shower without the saran wrap.  Change dressing daily with gauze and paper tape. No lifting or driving.  Take aspirin 325mg  twice daily to prevent blood clots. Call Dr. Gladstone Lighter if any wound complications or temperature of 101 degrees F or over.  Call the office for an appointment to see Dr. Gladstone Lighter in two weeks: [phone] and ask for Dr. Charlestine Night nurse, Brunilda Payor.

## 2014-11-18 NOTE — Op Note (Signed)
NAME:  BLAIKE, PADUANO NO.:  [id number]  MEDICAL RECORD NO.:  [id number]  LOCATION:  [id number]                         FACILITY:  [facility]  PHYSICIAN:  Kipp Brood. Felis Quillin, M.D.DATE OF BIRTH:  [birth year]  DATE OF PROCEDURE:  11/17/2014 DATE OF DISCHARGE:                              OPERATIVE REPORT   SURGEON:  Kipp Brood. Gladstone Lighter, M.D.  ASSISTANT:  Tarri Glenn, M.D.  PREOPERATIVE DIAGNOSES: 1. 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 2. Foraminal stenosis of the L5 root on the right. 3. Herniated lumbar disk at L4-5 on the right. 4. Foraminal stenosis L4-5 on the right.  Note, he had bilateral leg     pain, but he had weakness of his right foot dorsiflexors.  POSTOPERATIVE DIAGNOSES: 1. 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 2. Foraminal stenosis of the L5 root on the right. 3. Herniated lumbar disk at L4-5 on the right. 4. Foraminal stenosis L4-5 on the right.  Note, he had bilateral leg     pain, but he had weakness of his right foot dorsiflexors.  OPERATION: 1. Central decompressive lumbar laminectomy at L4-L5. 2. Microdiskectomy at L4-5 on the right with the specimen sent to     Pathology. 3. Foraminotomy for the L4 root on the right. 4. Foraminotomy for the L5 root on the right.  DESCRIPTION OF PROCEDURE:  Under general anesthesia, routine orthopedic prep and draping of the lower back was carried out.  At this time, after the appropriate time-out was carried out, then I also marked the appropriate right side of his back in the holding area even though we had to go central.  At this time, 2 needles were placed in the back for localization purposes.  X-ray was taken to verify the position. Incision then was made over the L4-5 space and extended proximally and distally.  We then went down, stripped the muscle from the lamina and spinous process.  Note, prior to making the incision, I injected 20 mL of 0.5% Marcaine with epinephrine into the soft  tissue to cut down the bleeding.  After we then stripped the muscle from the lamina and spinous process, another x-ray was taken with a Kocher clamp in place. Following that, we then went down, did our central decompression at L4- 5.  The microscope was brought in.  We extended the decompression proximally and distally until we had a good thorough opening of the canal.  After doing this, we then protected the dura, we removed the ligamentum flavum.  We went out far laterally and decompressed the lateral recess on the right.  It was extremely tight.  We isolated the nerve root very easily.  We identified the disk.  Prior to doing this, another x-ray was taken to verify the 4-5 position.  We went up a little further north because of the foraminal nature of the disk as well.  At this particular time, we did a foraminotomy for the 4 and 5 roots on the right.  We then gently retracted the dura with D'Errico and inserted a spinal needle to make sure we were in the space and we were.  We then made a cruciate incision  in the posterior longitudinal ligament and a microdiskectomy was carried out in the usual fashion.  I utilized the nerve hook and the Epstein curettes to curette out the material, went down into the space with the pituitary rongeurs, and then decompressed out in the foramen at 4-5 with the Epstein curette to make sure there were no other fragments.  After we completed dissection, the dura was easily movable.  The L5 root was easily movable.  We were able to easily pass a hockey-stick out the 4 foramen and the 5 foramen.  We thoroughly irrigated out the area, loosely applied some thrombin-soaked Gelfoam, and closed the wound in layers in the usual fashion.  Prior to closing the wound after I closed the lumbosacral fascia, I injected 20 mL of Exparel into the lumbar muscle and the subcu.  The remaining part of the wound was closed in usual fashion.  As I mentioned, I closed the  skin with staples.  Sterile Neosporin dressing was applied.  Prior to surgery, he had 2 g of IV Ancef.          ______________________________ Kipp Brood. Gladstone Lighter, M.D.     RAG/MEDQ  D:  11/17/2014  T:  11/18/2014  Job:  [id number]

## 2014-11-18 NOTE — Progress Notes (Signed)
Occupational Therapy Evaluation Patient Details Name: Brian Guzman MRN: [id number] DOB: 12-Dec-1965 Today's Date: 11/18/2014    History of Present Illness CENTRAL DECOMPRESSIVE LUMBAR LAMINECTOMY AT L4-L5. MICRODISCECTOMY AT L4-L5 ON RIGHT. FORAMINOTOMY FOR THE L4 AND L5 NERVE ROOT ON THE RIGHT.    Clinical Impression   All education complete. No further OT needs.    Follow Up Recommendations  No OT follow up;Supervision - Intermittent    Equipment Recommendations  None recommended by OT    Recommendations for Other Services       Precautions / Restrictions Precautions Precautions: Back Precaution Booklet Issued: Yes (comment) Precaution Comments: reviewed back precautions with patient Restrictions Weight Bearing Restrictions: No      Mobility Bed Mobility             Transfers                   Balance                                            ADL Overall ADL's : Needs assistance/impaired                                       General ADL Comments: Educated patient on back precautions and impact on ADLs. Patient reports family can A with LB dressing as needed but educated on techniques. Patient has access to a reacher and educated on its uses. Patient denies need to practice toilet transfer and walk-in shower transfer and reports he got up with PT and did well; PT confirms this. No further OT needs at this time.     Vision     Perception     Praxis      Pertinent Vitals/Pain Pain Assessment: 0-10 Pain Score: 2  Pain Location: back Pain Descriptors / Indicators: Tender Pain Intervention(s): Monitored during session;Limited activity within patient's tolerance     Hand Dominance     Extremity/Trunk Assessment Upper Extremity Assessment Upper Extremity Assessment: Overall WFL for tasks assessed   Lower Extremity Assessment Lower Extremity Assessment: Defer to PT evaluation   Cervical / Trunk  Assessment Cervical / Trunk Assessment: Normal   Communication Communication Communication: No difficulties   Cognition Arousal/Alertness: Awake/alert Behavior During Therapy: WFL for tasks assessed/performed Overall Cognitive Status: Within Functional Limits for tasks assessed                     General Comments       Exercises       Shoulder Instructions      Home Living Family/patient expects to be discharged to:: Private residence Living Arrangements: Spouse/significant other Available Help at Discharge: Friend(s) Type of Home: House Home Access: Stairs to enter     Home Layout: One level     Bathroom Shower/Tub: Occupational psychologist: Standard     Home Equipment: Shower seat   Additional Comments: has vanity/sink next to toilet      Prior Functioning/Environment Level of Independence: Independent             OT Diagnosis: Acute pain   OT Problem List: Decreased knowledge of use of DME or AE;Decreased knowledge of precautions;Pain   OT Treatment/Interventions:      OT Goals(Current goals can be found  in the care plan section) Acute Rehab OT Goals Patient Stated Goal: to go home today OT Goal Formulation: All assessment and education complete, DC therapy Potential to Achieve Goals: Good  OT Frequency:     Barriers to D/C:            Co-evaluation              End of Session    Activity Tolerance: Patient tolerated treatment well Patient left: in chair;with call bell/phone within reach;with family/visitor present   Time: [id number] OT Time Calculation (min): 12 min Charges:  OT General Charges $OT Visit: 1 Procedure OT Evaluation $Initial OT Evaluation Tier I: 1 Procedure G-Codes: OT G-codes **NOT FOR INPATIENT CLASS** Functional Assessment Tool Used: clinical judgment Functional Limitation: Self care Self Care Current Status [id number]): At least 20 percent but less than 40 percent impaired, limited or  restricted Self Care Goal Status [id number]): At least 1 percent but less than 20 percent impaired, limited or restricted Self Care Discharge Status [phone]): At least 20 percent but less than 40 percent impaired, limited or restricted  Donalda Job A 11/18/2014, 1:30 PM

## 2014-11-18 NOTE — Progress Notes (Addendum)
   Subjective: 1 Day Post-Op Procedure(s) (LRB): CENTRAL DECOMPRESSIVE LUMBAR LAMINECTOMY AT L4-L5.  MICRODISCECTOMY AT L4-L5 ON RIGHT.  FORAMINOTOMY FOR THE L4 AND L5 NERVE ROOT ON THE RIGHT. (Right) Patient reports pain as mild.   Patient seen in rounds with Dr. Gladstone Lighter. Patient is well, and has had no acute complaints or problems other than some soreness in his back. No leg pain. No SOB or chest pain. No issues overnight. Voiding well. Positive flatus.    Objective: Vital signs in last 24 hours: Temp:  [97.4 F (36.3 C)-98.9 F (37.2 C)] 97.5 F (36.4 C) (11/17 0442) Pulse Rate:  [99-120] 100 (11/17 0442) Resp:  [11-18] 16 (11/17 0442) BP: (113-130)/(71-95) 125/72 mmHg (11/17 0442) SpO2:  [96 %-100 %] 98 % (11/17 0442) Weight:  [100.699 kg (222 lb)] 100.699 kg (222 lb) (11/16 1500)  Intake/Output from previous day:  Intake/Output Summary (Last 24 hours) at 11/18/14 0740 Last data filed at 11/18/14 0443  Gross per 24 hour  Intake   3140 ml  Output   1825 ml  Net   1315 ml     EXAM General - Patient is Alert and Oriented Extremity - Neurologically intact Neurovascular intact Dorsiflexion/Plantar flexion intact Compartment soft Dressing/Incision - clean, dry Motor Function - intact, moving foot and toes well on exam.   Past Medical History  Diagnosis Date  . Hypertension   . Hypothyroidism     Assessment/Plan: 1 Day Post-Op Procedure(s) (LRB): CENTRAL DECOMPRESSIVE LUMBAR LAMINECTOMY AT L4-L5.  MICRODISCECTOMY AT L4-L5 ON RIGHT.  FORAMINOTOMY FOR THE L4 AND L5 NERVE ROOT ON THE RIGHT. (Right) Active Problems:   Spinal stenosis, lumbar region, with neurogenic claudication  Estimated body mass index is 32.77 kg/(m^2) as calculated from the following:   Height as of this encounter: 5\' 9"  (1.753 m).   Weight as of this encounter: 100.699 kg (222 lb). Advance diet Up with therapy D/C IV fluids Discharge home  DVT Prophylaxis - Aspirin Weight-Bearing as  tolerated  He is doing well. Will have him work with therapy this morning. Plan for DC home today. DC instructions discussed.   Ardeen Jourdain, PA-C Orthopaedic Surgery 11/18/2014, 7:40 AM

## 2014-11-18 NOTE — Evaluation (Signed)
Physical Therapy Evaluation Patient Details Name: Brian Guzman MRN: [id number] DOB: [birth year] Today's Date: 11/18/2014   History of Present Illness  CENTRAL DECOMPRESSIVE LUMBAR LAMINECTOMY AT L4-L5. MICRODISCECTOMY AT L4-L5 ON RIGHT. FORAMINOTOMY FOR THE L4 AND L5 NERVE ROOT ON THE RIGHT.   Clinical Impression  Patient is tolerating very well. No further needs.    Follow Up Recommendations No PT follow up    Equipment Recommendations  None recommended by PT    Recommendations for Other Services       Precautions / Restrictions Precautions Precautions: Back Precaution Booklet Issued: Yes (comment) Precaution Comments: reviewed  abck precautions      Mobility  Bed Mobility Overal bed mobility: Modified Independent Bed Mobility: Rolling;Sidelying to Sit Rolling: Independent Sidelying to sit: Modified independent (Device/Increase time)       General bed mobility comments: used rail to push up  Transfers Overall transfer level: Independent Equipment used: Rolling walker (2 wheeled)             General transfer comment: cues for safet,  kept the back straight for sitting down  Ambulation/Gait Ambulation/Gait assistance: Supervision Ambulation Distance (Feet): 300 Feet Assistive device: None Gait Pattern/deviations: WFL(Within Functional Limits)     General Gait Details: ditched toh RW after 50', no problems ambulating, somewaht guarded in back  Stairs            Wheelchair Mobility    Modified Rankin (Stroke Patients Only)       Balance                                             Pertinent Vitals/Pain Pain Assessment: 0-10 Pain Score: 4  Pain Location: back Pain Descriptors / Indicators: Tender Pain Intervention(s): Premedicated before session;Monitored during session;Repositioned    Home Living Family/patient expects to be discharged to:: Private residence Living Arrangements: Spouse/significant  other Available Help at Discharge: Friend(s) Type of Home: House Home Access: Stairs to enter     Home Layout: One level Home Equipment: None      Prior Function Level of Independence: Independent               Hand Dominance        Extremity/Trunk Assessment               Lower Extremity Assessment: Overall WFL for tasks assessed      Cervical / Trunk Assessment: Normal  Communication   Communication: No difficulties  Cognition Arousal/Alertness: Awake/alert Behavior During Therapy: WFL for tasks assessed/performed Overall Cognitive Status: Within Functional Limits for tasks assessed                      General Comments      Exercises        Assessment/Plan    PT Assessment Patent does not need any further PT services  PT Diagnosis     PT Problem List    PT Treatment Interventions     PT Goals (Current goals can be found in the Care Plan section) Acute Rehab PT Goals Patient Stated Goal: to go home today PT Goal Formulation: All assessment and education complete, DC therapy    Frequency     Barriers to discharge        Co-evaluation               End of  Session   Activity Tolerance: Patient tolerated treatment well Patient left: in chair;with call bell/phone within reach Nurse Communication: Mobility status    Functional Assessment Tool Used: clinical judgement Functional Limitation: Mobility: Walking and moving around Mobility: Walking and Moving Around Current Status [id number]): At least 1 percent but less than 20 percent impaired, limited or restricted Mobility: Walking and Moving Around Goal Status [phone]): At least 1 percent but less than 20 percent impaired, limited or restricted Mobility: Walking and Moving Around Discharge Status [phone]): At least 1 percent but less than 20 percent impaired, limited or restricted    Time: 1043-1056 PT Time Calculation (min) (ACUTE ONLY): 13 min   Charges:   PT  Evaluation $Initial PT Evaluation Tier I: 1 Procedure     PT G Codes:   PT G-Codes **NOT FOR INPATIENT CLASS** Functional Assessment Tool Used: clinical judgement Functional Limitation: Mobility: Walking and moving around Mobility: Walking and Moving Around Current Status [id number]): At least 1 percent but less than 20 percent impaired, limited or restricted Mobility: Walking and Moving Around Goal Status [phone]): At least 1 percent but less than 20 percent impaired, limited or restricted Mobility: Walking and Moving Around Discharge Status [phone]): At least 1 percent but less than 20 percent impaired, limited or restricted    Claretha Cooper 11/18/2014, 11:06 AM Tresa Endo PT [phone]

## 2014-11-22 NOTE — Discharge Summary (Signed)
Physician Discharge Summary   Patient ID: Brian Guzman MRN: [id number] DOB/AGE: 1965/04/11 49 y.o.  Admit date: 11/17/2014 Discharge date: 11/18/2014  Primary Diagnosis: Lumbar spinal stenosis   Admission Diagnoses:  Past Medical History  Diagnosis Date  . Hypertension   . Hypothyroidism    Discharge Diagnoses:   Active Problems:   Spinal stenosis, lumbar region, with neurogenic claudication  Estimated body mass index is 32.77 kg/(m^2) as calculated from the following:   Height as of this encounter: 5' 9" (1.753 m).   Weight as of this encounter: 100.699 kg (222 lb).  Procedure:  Procedure(s) (LRB): CENTRAL DECOMPRESSIVE LUMBAR LAMINECTOMY AT L4-L5.  MICRODISCECTOMY AT L4-L5 ON RIGHT.  FORAMINOTOMY FOR THE L4 AND L5 NERVE ROOT ON THE RIGHT. (Right)   Consults: None  HPI: The patient presented with the chief complaint of low back pain. He reports that he has a history of low back pain, but he has had increased pain in the past 6 months without any known injury. He has pain in both legs, right greater than left. No change in bladder or bowel function. He has been taking Meloxicam which gives him mild relief. He has pain at all times, even at rest. MRI showed bilateral foraminal disc herniation at L4-L5. CT myelogram showed significant stenosis at L4-L5.    Laboratory Data: Hospital Outpatient Visit on 11/10/2014  Component Date Value Ref Range Status  . aPTT 11/10/2014 29  24 - 37 seconds Final  . WBC 11/10/2014 7.2  4.0 - 10.5 K/uL Final  . RBC 11/10/2014 4.88  4.22 - 5.81 MIL/uL Final  . Hemoglobin 11/10/2014 15.6  13.0 - 17.0 g/dL Final  . HCT 11/10/2014 44.7  39.0 - 52.0 % Final  . MCV 11/10/2014 91.6  78.0 - 100.0 fL Final  . MCH 11/10/2014 32.0  26.0 - 34.0 pg Final  . MCHC 11/10/2014 34.9  30.0 - 36.0 g/dL Final  . RDW 11/10/2014 12.5  11.5 - 15.5 % Final  . Platelets 11/10/2014 230  150 - 400 K/uL Final  . Neutrophils Relative % 11/10/2014 53   Final  .  Neutro Abs 11/10/2014 3.8  1.7 - 7.7 K/uL Final  . Lymphocytes Relative 11/10/2014 30   Final  . Lymphs Abs 11/10/2014 2.1  0.7 - 4.0 K/uL Final  . Monocytes Relative 11/10/2014 12   Final  . Monocytes Absolute 11/10/2014 0.9  0.1 - 1.0 K/uL Final  . Eosinophils Relative 11/10/2014 4   Final  . Eosinophils Absolute 11/10/2014 0.3  0.0 - 0.7 K/uL Final  . Basophils Relative 11/10/2014 1   Final  . Basophils Absolute 11/10/2014 0.1  0.0 - 0.1 K/uL Final  . Sodium 11/10/2014 137  135 - 145 mmol/L Final  . Potassium 11/10/2014 3.7  3.5 - 5.1 mmol/L Final  . Chloride 11/10/2014 103  101 - 111 mmol/L Final  . CO2 11/10/2014 27  22 - 32 mmol/L Final  . Glucose, Bld 11/10/2014 97  65 - 99 mg/dL Final  . BUN 11/10/2014 16  6 - 20 mg/dL Final  . Creatinine, Ser 11/10/2014 0.88  0.61 - 1.24 mg/dL Final  . Calcium 11/10/2014 9.0  8.9 - 10.3 mg/dL Final  . Total Protein 11/10/2014 8.0  6.5 - 8.1 g/dL Final  . Albumin 11/10/2014 4.5  3.5 - 5.0 g/dL Final  . AST 11/10/2014 24  15 - 41 U/L Final  . ALT 11/10/2014 39  17 - 63 U/L Final  . Alkaline Phosphatase 11/10/2014 63  38 - 126 U/L Final  . Total Bilirubin 11/10/2014 0.9  0.3 - 1.2 mg/dL Final  . GFR calc non Af Amer 11/10/2014 >60  >60 mL/min Final  . GFR calc Af Amer 11/10/2014 >60  >60 mL/min Final   Comment: (NOTE) The eGFR has been calculated using the CKD EPI equation. This calculation has not been validated in all clinical situations. eGFR's persistently <60 mL/min signify possible Chronic Kidney Disease.   . Anion gap 11/10/2014 7  5 - 15 Final  . Prothrombin Time 11/10/2014 13.8  11.6 - 15.2 seconds Final  . INR 11/10/2014 1.04  0.00 - 1.49 Final  . MRSA, PCR 11/10/2014 NEGATIVE  NEGATIVE Final  . Staphylococcus aureus 11/10/2014 NEGATIVE  NEGATIVE Final   Comment:        The Xpert SA Assay (FDA approved for NASAL specimens in patients over 54 years of age), is one component of a comprehensive surveillance program.  Test  performance has been validated by [facility] for patients greater than or equal to 33 year old. It is not intended to diagnose infection nor to guide or monitor treatment.      X-Rays:Dg Chest 2 View  11/10/2014  CLINICAL DATA:  Preoperative examination prior to lumbar surgery ; no history of cardiopulmonary abnormality. EXAM: CHEST  2 VIEW COMPARISON:  Abdominal scout film from a lumbar spine CT scan of October 22, 2014 FINDINGS: The left hemidiaphragm remains elevated. Both lungs are clear. The heart and pulmonary vascularity are normal. The mediastinum is normal in width. There is no pleural effusion. There is mild anterior wedging of the body of T11 anteriorly. This was visible on a lumbar spine series of February 01, 2011. IMPRESSION: 1. There is no active cardiopulmonary disease. There is mild stable elevation of the left hemidiaphragm. 2. Chronic anterior wedge compression of the body of T11 with loss of height anteriorly of approximately 30%. Electronically Signed   By: David  Martinique M.D.   On: 11/10/2014 10:29   Dg Lumbar Spine 2-3 Views  11/10/2014  CLINICAL DATA:  Preop for lumbar laminectomy EXAM: LUMBAR SPINE - 2-3 VIEW COMPARISON:  CT lumbar spine of 10/22/2014 FINDINGS: The lumbar vertebrae are in normal alignment. There is slight loss of disc space at L4-5. Anterior osteophyte formation is noted at T12-L1 and L1-2 levels. The SI joints are corticated. IMPRESSION: Mild degenerative disc disease at L4-5.  Normal alignment. Electronically Signed   By: Ivar Drape M.D.   On: 11/10/2014 10:27   Dg Spine Portable 1 View  11/17/2014  CLINICAL DATA:  L4-5 decompression. EXAM: PORTABLE SPINE - 1 VIEW COMPARISON:  Earlier today at 1153 hours. FINDINGS: 1210 hours. Surgical device projects posterior to the inferior aspect of the L4 vertebral body. Degenerative disc disease involves T12-L1 and L1-2. IMPRESSION: Intraoperative localization of the inferior aspect of the L4 vertebral body.  Electronically Signed   By: Abigail Miyamoto M.D.   On: 11/17/2014 12:56   Dg Spine Portable 1 View  11/17/2014  CLINICAL DATA:  Intraoperative localization for lumbar decompression EXAM: PORTABLE SPINE - 1 VIEW COMPARISON:  Film from earlier in the same day FINDINGS: Surgical instrument is noted at the level of the L4 spinous process. Numbering nomenclature similar to that utilized on the earlier film. Degenerative changes at the L1-2 interspace are seen. IMPRESSION: Intraoperative localization at L4-5 Electronically Signed   By: Inez Catalina M.D.   On: 11/17/2014 12:39   Dg Spine Portable 1 View  11/17/2014  CLINICAL DATA:  Intraoperative localization for L4-5 decompression EXAM: PORTABLE SPINE - 1 VIEW COMPARISON:  09/03/2014 FINDINGS: Needles are noted in the posterior soft tissues adjacent to the L4 spinous process. The numbering nomenclature is similar to that utilized on the recent MRI examination. IMPRESSION: Intraoperative localization at L4-5. Electronically Signed   By: Inez Catalina M.D.   On: 11/17/2014 12:11    EKG: Orders placed or performed during the hospital encounter of 11/10/14  . EKG  . EKG     Hospital Course: Brian Guzman is a 49 y.o. who was admitted to [facility]. They were brought to the operating room on 11/17/2014 and underwent Procedure(s): CENTRAL DECOMPRESSIVE LUMBAR LAMINECTOMY AT L4-L5.  MICRODISCECTOMY AT L4-L5 ON RIGHT.  FORAMINOTOMY FOR THE L4 AND L5 NERVE ROOT ON THE RIGHT.Marland Kitchen  Patient tolerated the procedure well and was later transferred to the recovery room and then to the orthopaedic floor for postoperative care.  They were given PO and IV analgesics for pain control following their surgery.  They were given 24 hours of postoperative antibiotics of  Anti-infectives    Start     Dose/Rate Route Frequency Ordered Stop   11/17/14 1800  ceFAZolin (ANCEF) IVPB 1 g/50 mL premix     1 g 100 mL/hr over 30 Minutes Intravenous Every 8 hours 11/17/14 1508  11/18/14 1101   11/17/14 1157  polymyxin B 500,000 Units, bacitracin 50,000 Units in sodium chloride irrigation 0.9 % 500 mL irrigation  Status:  Discontinued       As needed 11/17/14 1157 11/17/14 1343   11/17/14 0853  ceFAZolin (ANCEF) IVPB 2 g/50 mL premix     2 g 100 mL/hr over 30 Minutes Intravenous On call to O.R. 11/17/14 [id number] 11/17/14 1142     and started on DVT prophylaxis in the form of Aspirin.   PT was ordered. Discharge planning consulted to help with postop disposition and equipment needs.  Patient had a good night on the evening of surgery.  They started to get up OOB with therapy on day one.  Dressing was changed and the incision was clean and dry.  Patient was seen in rounds and was ready to go home.   Diet: Cardiac diet Activity:WBAT Follow-up:in 2 weeks Disposition - Home Discharged Condition: stable   Discharge Instructions    Call MD / Call 911    Complete by:  As directed   If you experience chest pain or shortness of breath, CALL 911 and be transported to the hospital emergency room.  If you develope a fever above 101 F, pus (white drainage) or increased drainage or redness at the wound, or calf pain, call your surgeon's office.     Constipation Prevention    Complete by:  As directed   Drink plenty of fluids.  Prune juice may be helpful.  You may use a stool softener, such as Colace (over the counter) 100 mg twice a day.  Use MiraLax (over the counter) for constipation as needed.     Diet - low sodium heart healthy    Complete by:  As directed      Discharge instructions    Complete by:  As directed   For the first three days, remove your dressing, tape a piece of saran wrap over your incision.  Take your shower, then remove the saran wrap and put a clean dressing on. After three days you can shower without the saran wrap.  Change dressing daily with gauze and paper tape. No  lifting or driving.  Take aspirin 353m twice daily to prevent blood clots. Call Dr.  GGladstone Lighterif any wound complications or temperature of 101 degrees F or over.  Call the office for an appointment to see Dr. GGladstone Lighterin two weeks: [phone] ask for Dr. GCharlestine Nightnurse, TBrunilda Payor     Driving restrictions    Complete by:  As directed   No driving while taking pain medications     Increase activity slowly as tolerated    Complete by:  As directed      Lifting restrictions    Complete by:  As directed   No lifting            Medication List    TAKE these medications        levothyroxine 50 MCG tablet  Commonly known as:  SYNTHROID, LEVOTHROID  Take 50 mcg by mouth every evening.     losartan-hydrochlorothiazide 100-25 MG tablet  Commonly known as:  HYZAAR  Take 1 tablet by mouth every evening.     meloxicam 7.5 MG tablet  Commonly known as:  MOBIC  Take 7.5 mg by mouth every evening.     methocarbamol 500 MG tablet  Commonly known as:  ROBAXIN  Take 1 tablet (500 mg total) by mouth every 6 (six) hours as needed for muscle spasms.     oxyCODONE-acetaminophen 5-325 MG tablet  Commonly known as:  PERCOCET/ROXICET  Take 1-2 tablets by mouth every 4 (four) hours as needed for moderate pain.           Follow-up Information    Follow up with GIOFFRE,RONALD A, MD. Schedule an appointment as soon as possible for a visit in 2 weeks.   Specialty:  Orthopedic Surgery   Contact information:   [phone]      Signed: AArdeen Jourdain PA-C Orthopaedic Surgery 11/22/2014, 8:58 AM

## 2015-02-07 ENCOUNTER — Other Ambulatory Visit: Payer: Self-pay

## 2015-09-15 ENCOUNTER — Encounter (HOSPITAL_COMMUNITY): Payer: Self-pay | Admitting: Emergency Medicine

## 2015-09-15 ENCOUNTER — Emergency Department (HOSPITAL_COMMUNITY)
Admission: EM | Admit: 2015-09-15 | Discharge: 2015-09-15 | Disposition: A | Discharge status: Home / Self Care | Payer: 59 | Attending: Physician Assistant | Admitting: Physician Assistant

## 2015-09-15 DIAGNOSIS — I10 Essential (primary) hypertension: Secondary | ICD-10-CM | POA: Diagnosis not present

## 2015-09-15 DIAGNOSIS — R202 Paresthesia of skin: Secondary | ICD-10-CM

## 2015-09-15 DIAGNOSIS — E039 Hypothyroidism, unspecified: Secondary | ICD-10-CM | POA: Insufficient documentation

## 2015-09-15 DIAGNOSIS — E876 Hypokalemia: Secondary | ICD-10-CM

## 2015-09-15 LAB — BASIC METABOLIC PANEL
ANION GAP: 9 (ref 5–15)
BUN: 10 mg/dL (ref 6–20)
CHLORIDE: 103 mmol/L (ref 101–111)
CO2: 25 mmol/L (ref 22–32)
Calcium: 8.8 mg/dL — ABNORMAL LOW (ref 8.9–10.3)
Creatinine, Ser: 0.78 mg/dL (ref 0.61–1.24)
GFR calc non Af Amer: >60 mL/min (ref >60)
Glucose, Bld: 100 mg/dL — ABNORMAL HIGH (ref 65–99)
POTASSIUM: 3 mmol/L — AB (ref 3.5–5.1)
SODIUM: 137 mmol/L (ref 135–145)

## 2015-09-15 LAB — CBC
HEMATOCRIT: 44.1 % (ref 39.0–52.0)
HEMOGLOBIN: 15.3 g/dL (ref 13.0–17.0)
MCH: 31.2 pg (ref 26.0–34.0)
MCHC: 34.7 g/dL (ref 30.0–36.0)
MCV: 90 fL (ref 78.0–100.0)
Platelets: 239 10*3/uL (ref 150–400)
RBC: 4.9 MIL/uL (ref 4.22–5.81)
RDW: 12.7 % (ref 11.5–15.5)
WBC: 6.8 10*3/uL (ref 4.0–10.5)

## 2015-09-15 LAB — I-STAT TROPONIN, ED: Troponin i, poc: 0 ng/mL (ref 0.00–0.08)

## 2015-09-15 MED ORDER — POTASSIUM CHLORIDE CRYS ER 20 MEQ PO TBCR: 20.0000 meq | EXTENDED_RELEASE_TABLET | Freq: Every day | ORAL | 0 refills | Status: DC

## 2015-09-15 MED ORDER — POTASSIUM CHLORIDE CRYS ER 20 MEQ PO TBCR
40.0000 meq | EXTENDED_RELEASE_TABLET | Freq: Once | ORAL | Status: AC
  Administered 2015-09-15: 40 meq via ORAL
  Filled 2015-09-15: qty 2

## 2015-09-15 NOTE — ED Triage Notes (Signed)
Pt states just 30 minutes ago they removed his mother off on life support and he had a sudden onset of left arm pain and it felt tingling. Pt denies any pain at this time and states it just feels tingling. Pt has equal grip strengths and sensation is equal in all extremities. Pt has a NIH of 0 at this time.

## 2015-09-15 NOTE — ED Notes (Signed)
Pt states he is going upstairs to see his mother that was just taken from life support. Pt advised he should stay and be seen by a MD but pt is free to leave if he would like.

## 2015-09-15 NOTE — ED Provider Notes (Signed)
[facility] DEPT Provider Note   CSN: [id number] Arrival date & time: 09/15/15  1416     History   Chief Complaint Chief Complaint  Patient presents with  . Tingling    HPI Brian Guzman is a 50 y.o. male.  HPI   50 year old male with history of spinal stenosis in the lumbar region with neurologic claudication presenting today for evaluation of tingling sensation to his left arm. Patient report his mother has been in the ICU at this hospital ongoing for the past 1-2 weeks. Today the hospital staff decided to withdraw her from life support. Shortly hearing the news, patient developed pain and tingling sensation throughout his left arm. Episode lasting for approximately 30-40 minutes and has since resolved. At the urging of the hospital staff, patient is here for further evaluation. Patient currently denies having any headache, neck pain, chest pain, difficulty breathing, jaw pain, diaphoresis, short of breath, lightheadedness, focal weakness. He is left-hand dominant. He is not a smoker, no significant cardiac history and no significant history concerning for stroke. He attributes his symptoms to hearing the news about his mom.  He report prior hx of lumbar spinal stenosis which has been surgically repaired and denies any worsening back pain.    Past Medical History:  Diagnosis Date  . Hypertension   . Hypothyroidism     Patient Active Problem List   Diagnosis Date Noted  . Spinal stenosis, lumbar region, with neurogenic claudication 11/17/2014    Past Surgical History:  Procedure Laterality Date  . LUMBAR LAMINECTOMY/DECOMPRESSION MICRODISCECTOMY Right 11/17/2014   Procedure: CENTRAL DECOMPRESSIVE LUMBAR LAMINECTOMY AT L4-L5.  MICRODISCECTOMY AT L4-L5 ON RIGHT.  FORAMINOTOMY FOR THE L4 AND L5 NERVE ROOT ON THE RIGHT.;  Surgeon: Latanya Maudlin, MD;  Location: [facility] ORS;  Service: Orthopedics;  Laterality: Right;  . TONSILLECTOMY         Home Medications    Prior to  Admission medications   Medication Sig Start Date End Date Taking? Authorizing Provider  levothyroxine (SYNTHROID, LEVOTHROID) 50 MCG tablet Take 50 mcg by mouth every evening. 10/06/14   Historical Provider, MD  losartan-hydrochlorothiazide (HYZAAR) 100-25 MG tablet Take 1 tablet by mouth every evening.  09/27/14   Historical Provider, MD  meloxicam (MOBIC) 7.5 MG tablet Take 7.5 mg by mouth every evening.  09/22/14   Historical Provider, MD  methocarbamol (ROBAXIN) 500 MG tablet Take 1 tablet (500 mg total) by mouth every 6 (six) hours as needed for muscle spasms. 11/18/14   Amber Cecilio Asper, PA-C  oxyCODONE-acetaminophen (PERCOCET/ROXICET) 5-325 MG tablet Take 1-2 tablets by mouth every 4 (four) hours as needed for moderate pain. 11/18/14   Ardeen Jourdain, PA-C    Family History No family history on file.  Social History Social History  Substance Use Topics  . Smoking status: Never Smoker  . Smokeless tobacco: Never Used  . Alcohol use No     Allergies   Review of patient's allergies indicates no known allergies.   Review of Systems Review of Systems  All other systems reviewed and are negative.    Physical Exam Updated Vital Signs BP 144/92 (BP Location: Right Arm)   Pulse 71   Temp 98 F (36.7 C) (Oral)   Resp 16   Ht 5\' 9"  (1.753 m)   Wt 95.3 kg   SpO2 100%   BMI 31.01 kg/m   Physical Exam  Constitutional: He appears well-developed and well-nourished. No distress.  HENT:  Head: Atraumatic.  Eyes: Conjunctivae are normal.  Neck:  Normal range of motion. Neck supple.  No cervical midline spine tenderness crepitus or step-off  Cardiovascular: Normal rate and regular rhythm.   Pulmonary/Chest: Effort normal and breath sounds normal.  Musculoskeletal: Normal range of motion.  Left arm: nontender to palpation no gross deformity, full range of motion throughout left shoulder elbow and wrist with normal grip strength, radial pulse 2+, all compartments are soft    Neurological: He is alert.  Skin: No rash noted.  Psychiatric: He has a normal mood and affect.  Nursing note and vitals reviewed.    ED Treatments / Results  Labs (all labs ordered are listed, but only abnormal results are displayed) Labs Reviewed  BASIC METABOLIC PANEL - Abnormal; Notable for the following:       Result Value   Potassium 3.0 (*)    Glucose, Bld 100 (*)    Calcium 8.8 (*)    All other components within normal limits  CBC  I-STAT TROPOININ, ED    EKG  EKG Interpretation None      Date: 09/15/2015  Rate: 79  Rhythm: normal sinus rhythm with sinus arrhythmia  QRS Axis: normal  Intervals: normal  ST/T Wave abnormalities: normal  Conduction Disutrbances: none  Narrative Interpretation:   Old EKG Reviewed: No significant changes noted     Radiology No results found.  Procedures Procedures (including critical care time)  Medications Ordered in ED Medications - No data to display   Initial Impression / Assessment and Plan / ED Course  I have reviewed the triage vital signs and the nursing notes.  Pertinent labs & imaging results that were available during my care of the patient were reviewed by me and considered in my medical decision making (see chart for details).  Clinical Course    BP 144/92 (BP Location: Right Arm)   Pulse 71   Temp 98 F (36.7 C) (Oral)   Resp 16   Ht 5\' 9"  (1.753 m)   Wt 95.3 kg   SpO2 100%   BMI 31.01 kg/m    Final Clinical Impressions(s) / ED Diagnoses   Final diagnoses:  Tingling in extremities  Hypokalemia, excessive renal losses    New Prescriptions New Prescriptions   POTASSIUM CHLORIDE SA (K-DUR,KLOR-CON) 20 MEQ TABLET    Take 1 tablet (20 mEq total) by mouth daily.   6:26 PM Patient with a brief episode of left arm pain after hearing the news that his mom will be taken off life support. His arm pain and tingling sensation has since resolved. He has no focal weakness or numbness on exam. He is  neurovascularly intact. She does not have any significant risk factor concerning for ACS or stroke.  I suspect sxs is stress induce.  Labs remarkable for mild hypokalemia with K+ 3.0, supplementation given.  EKG without U-waves or acute ischemic changes.  Mild hypoCalcemia, which may attributed to his sxs.  Otherwise pt stable for discharge.  Will give a short course of potassium supplementation.  Encourage f/u with PCP for further care.  Care discussed with Dr. Thomasene Lot.  All questions answered to pt's satisfaction.     Domenic Moras, PA-C 09/15/15 [place], MD 09/15/15 2358

## 2015-09-15 NOTE — Discharge Instructions (Signed)
Please follow up with your primary care provider for further evaluation of tingling sensation in your left arm.  Your potassium level is low today, which may be due to your blood pressure medication Hyzaar.  Take supplementation.  Eat a banana daily.  Return if your condition worsen or if you have other concerns.

## 2016-02-03 DIAGNOSIS — D239 Other benign neoplasm of skin, unspecified: Secondary | ICD-10-CM | POA: Diagnosis not present

## 2016-02-03 DIAGNOSIS — L910 Hypertrophic scar: Secondary | ICD-10-CM | POA: Diagnosis not present

## 2016-03-16 IMAGING — DX DG SPINE 1V PORT
1 series · 1 of 1 positions shown · non-contrast
Comparison: Earlier today at 5512 hours.

CLINICAL DATA: L4-5 decompression.

EXAM:
PORTABLE SPINE - 1 VIEW

[ls-spine x-table]
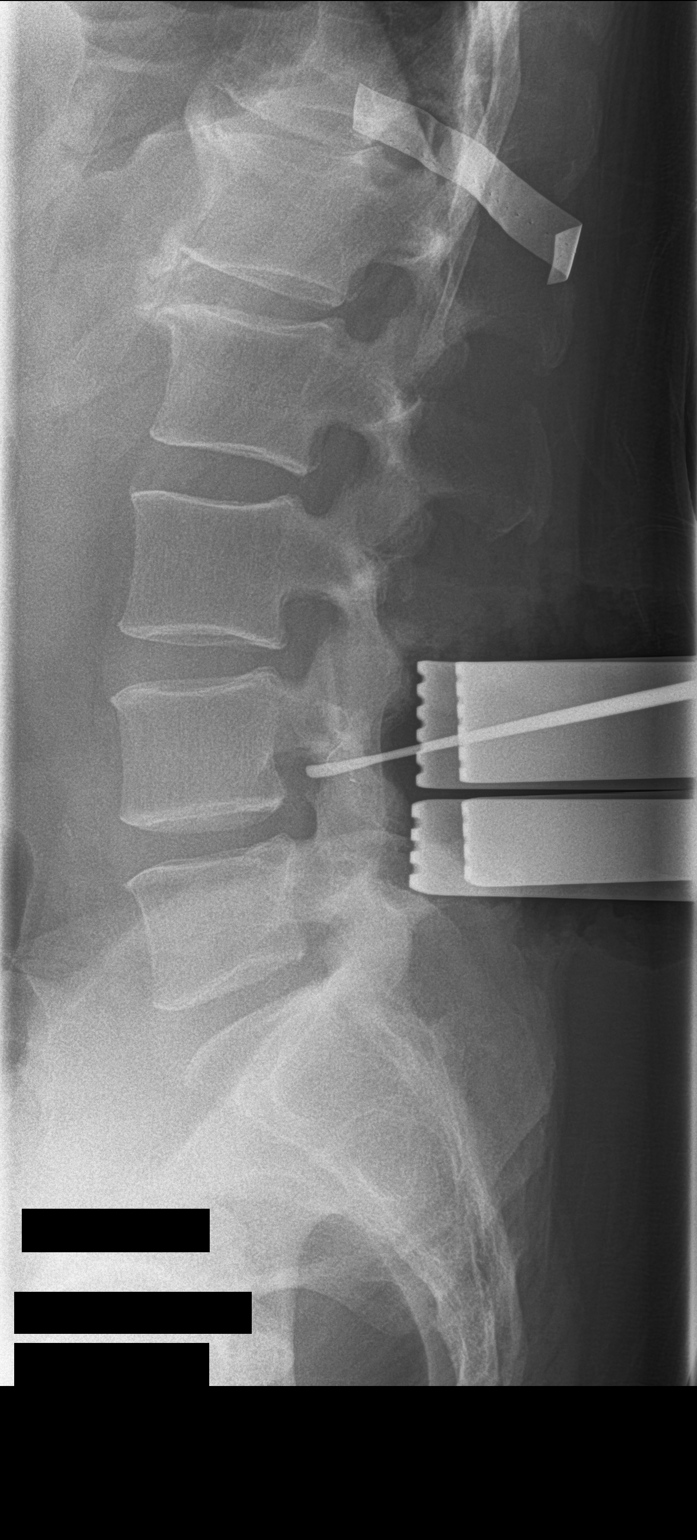

[1 of 1 positions shown; findings below may reference images not displayed]

FINDINGS: 5652 hours. Surgical device projects posterior to the inferior
aspect of the L4 vertebral body. Degenerative disc disease involves
T12-L1 and L1-2.
IMPRESSION: Intraoperative localization of the inferior aspect of the L4
vertebral body.

## 2016-03-16 IMAGING — DX DG SPINE 1V PORT
1 series · 1 of 1 positions shown · non-contrast
Comparison: 09/03/2014

CLINICAL DATA: Intraoperative localization for L4-5 decompression

EXAM:
PORTABLE SPINE - 1 VIEW

[ls-spine x-table]
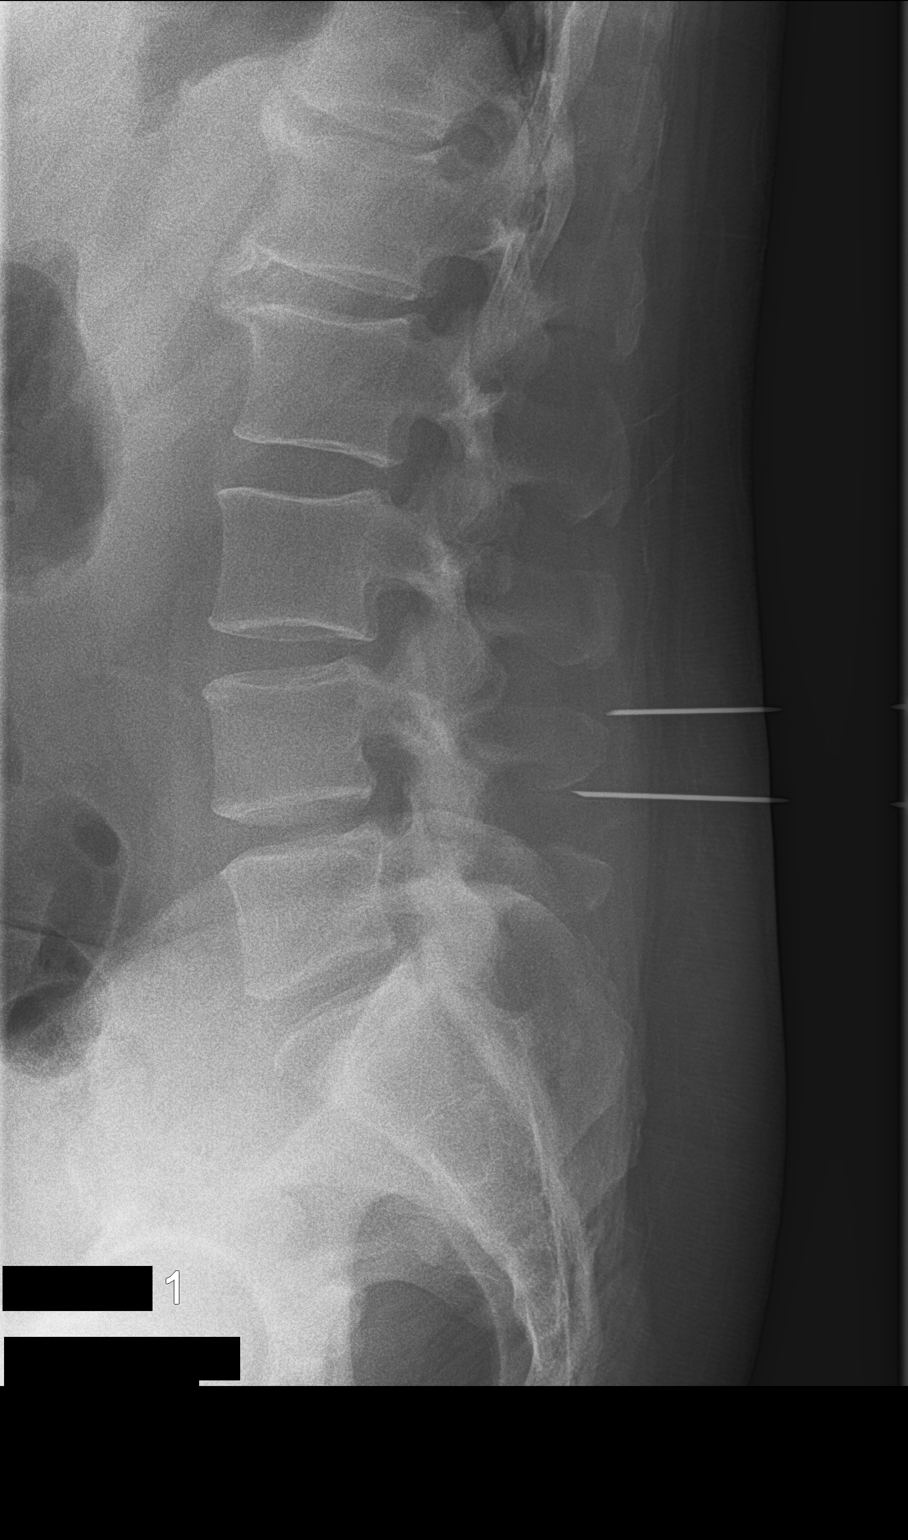

[1 of 1 positions shown; findings below may reference images not displayed]

FINDINGS: Needles are noted in the posterior soft tissues adjacent to the L4
spinous process. The numbering nomenclature is similar to that
utilized on the recent MRI examination.
IMPRESSION: Intraoperative localization at L4-5.

## 2016-03-19 ENCOUNTER — Ambulatory Visit (INDEPENDENT_AMBULATORY_CARE_PROVIDER_SITE_OTHER): Payer: 59 | Admitting: Physician Assistant

## 2016-03-19 ENCOUNTER — Encounter: Payer: Self-pay | Admitting: Physician Assistant

## 2016-03-19 VITALS — BP 142/100 | HR 80 | Temp 97.9°F | Resp 18 | Wt 210.4 lb

## 2016-03-19 DIAGNOSIS — K648 Other hemorrhoids: Secondary | ICD-10-CM | POA: Diagnosis not present

## 2016-03-19 MED ORDER — HYDROCORTISONE ACETATE 25 MG RE SUPP
25.0000 mg | Freq: Two times a day (BID) | RECTAL | 0 refills | Status: DC
Start: 2016-03-19 — End: 2016-04-16

## 2016-03-19 NOTE — Progress Notes (Signed)
    Patient ID: Brian Guzman MRN: [id number], DOB: [birth year], 51 y.o. Date of Encounter: 03/19/2016, 11:15 AM    Chief Complaint:  Chief Complaint  Patient presents with  . Hemorrhoids     HPI: 51 y.o. year old male presents with above.   He is in the process of becoming a new patient with Dr. Dennard Schaumann but has been having this pain so was scheduled with me.  Says that he has not seen a doctor about this in the past. Says that he thinks he has a hemorrhoid and has been self treating it using preparation H. Says there is an area that feels like it is just inside his rectum--that hurts really bad when has BM. Says that he doesn't have constipation but because it was so painful to have a bowel movement with this hemorrhoid, he did increase fiber and then yesterday even used Ex-Lax and Colace and so his stool was a lot looser yesterday and not as painful.  Also notes he "gets nervous at doctors and BP goes up"  Home Meds:   Outpatient Medications Prior to Visit  Medication Sig Dispense Refill  . losartan-hydrochlorothiazide (HYZAAR) 100-25 MG tablet Take 1 tablet by mouth every evening.   3  . minoxidil (LONITEN) 2.5 MG tablet Take 1.25 mg by mouth every evening.    . potassium chloride SA (K-DUR,KLOR-CON) 20 MEQ tablet Take 1 tablet (20 mEq total) by mouth daily. 3 tablet 0   No facility-administered medications prior to visit.     Allergies: No Known Allergies    Review of Systems: See HPI for pertinent ROS. All other ROS negative.    Physical Exam: Blood pressure (!) 142/100, pulse 80, temperature 97.9 F (36.6 C), temperature source Oral, resp. rate 18, weight 210 lb 6.4 oz (95.4 kg), SpO2 98 %., Body mass index is 31.07 kg/m. General:  WNWD WM. Appears in no acute distress. Neck: Supple. No thyromegaly. No lymphadenopathy. Lungs: Clear bilaterally to auscultation without wheezes, rales, or rhonchi. Breathing is unlabored. Heart: Regular rhythm. No murmurs, rubs, or  gallops. Msk:  Strength and tone normal for age. Extremities/Skin: Warm and dry. Anoscopy: Very small external hemorrhoid that is not inflamed or swollen. Small internal hemorrhoid visualized.  DRE---No mass, no area of tenderness, no fissure, no fistula Neuro: Alert and oriented X 3. Moves all extremities spontaneously. Gait is normal. CNII-XII grossly in tact. Psych:  Responds to questions appropriately with a normal affect.     ASSESSMENT AND PLAN:  51 y.o. year old male with  1. Internal hemorrhoid He is to use the Anusol [facility] as directed. continue high fiber diet and Colace if needed to keep stool soft. F/U if symptoms worsen or not controlled with this. - hydrocortisone (ANUSOL-HC) 25 MG suppository; Place 1 suppository (25 mg total) rectally 2 (two) times daily.  Dispense: 12 suppository; Refill: 0   Signed, [address] [place], [place], [facility] 03/19/2016 11:15 AM

## 2016-04-16 ENCOUNTER — Encounter: Payer: Self-pay | Admitting: Family Medicine

## 2016-04-16 ENCOUNTER — Ambulatory Visit (INDEPENDENT_AMBULATORY_CARE_PROVIDER_SITE_OTHER): Payer: 59 | Admitting: Family Medicine

## 2016-04-16 VITALS — BP 140/88 | HR 84 | Temp 98.2°F | Resp 16 | Ht 69.0 in | Wt 208.0 lb

## 2016-04-16 DIAGNOSIS — E291 Testicular hypofunction: Secondary | ICD-10-CM

## 2016-04-16 DIAGNOSIS — Z1211 Encounter for screening for malignant neoplasm of colon: Secondary | ICD-10-CM | POA: Diagnosis not present

## 2016-04-16 DIAGNOSIS — Z0000 Encounter for general adult medical examination without abnormal findings: Secondary | ICD-10-CM | POA: Diagnosis not present

## 2016-04-16 DIAGNOSIS — Z23 Encounter for immunization: Secondary | ICD-10-CM | POA: Diagnosis not present

## 2016-04-16 LAB — COMPLETE METABOLIC PANEL WITH GFR
ALT: 19 U/L (ref 9–46)
AST: 14 U/L (ref 10–35)
Albumin: 4.1 g/dL (ref 3.6–5.1)
Alkaline Phosphatase: 59 U/L (ref 40–115)
BILIRUBIN TOTAL: 0.4 mg/dL (ref 0.2–1.2)
BUN: 8 mg/dL (ref 7–25)
CALCIUM: 9.1 mg/dL (ref 8.6–10.3)
CHLORIDE: 105 mmol/L (ref 98–110)
CO2: 21 mmol/L (ref 20–31)
CREATININE: 0.72 mg/dL (ref 0.70–1.33)
GFR, Est Non African American: >89 mL/min (ref >=60)
Glucose, Bld: 103 mg/dL — ABNORMAL HIGH (ref 70–99)
Potassium: 4.2 mmol/L (ref 3.5–5.3)
Sodium: 139 mmol/L (ref 135–146)
TOTAL PROTEIN: 7.5 g/dL (ref 6.1–8.1)

## 2016-04-16 LAB — CBC WITH DIFFERENTIAL/PLATELET
BASOS ABS: 90 {cells}/uL (ref 0–200)
BASOS PCT: 2 %
EOS ABS: 225 {cells}/uL (ref 15–500)
Eosinophils Relative: 5 %
HCT: 49.5 % (ref 38.5–50.0)
Hemoglobin: 16.8 g/dL (ref 13.0–17.0)
LYMPHS PCT: 38 %
Lymphs Abs: 1710 cells/uL (ref 850–3900)
MCH: 30.4 pg (ref 27.0–33.0)
MCHC: 33.9 g/dL (ref 32.0–36.0)
MCV: 89.5 fL (ref 80.0–100.0)
MONO ABS: 585 {cells}/uL (ref 200–950)
MPV: 12.3 fL (ref 7.5–12.5)
Monocytes Relative: 13 %
Neutro Abs: 1890 cells/uL (ref 1500–7800)
Neutrophils Relative %: 42 %
Platelets: 224 10*3/uL (ref 140–400)
RBC: 5.53 MIL/uL (ref 4.20–5.80)
RDW: 13.6 % (ref 11.0–15.0)
WBC: 4.5 10*3/uL (ref 3.8–10.8)

## 2016-04-16 LAB — LIPID PANEL
Cholesterol: 139 mg/dL (ref <200)
HDL: 34 mg/dL — ABNORMAL LOW (ref >40)
LDL CALC: 90 mg/dL (ref <100)
Total CHOL/HDL Ratio: 4.1 Ratio (ref <5.0)
Triglycerides: 74 mg/dL (ref <150)
VLDL: 15 mg/dL (ref <30)

## 2016-04-16 NOTE — Progress Notes (Signed)
Subjective:    Patient ID: Brian Guzman, male    DOB: 05/08/1965, 51 y.o.   MRN: [id number]  HPI Patient is here today for complete physical exam.  Past medical history significant for hypothyroidism as well as hypogonadism which is being treated by another physician. He is currently receiving the testosterone pellets implanted every 6-8 months. He is also on Armour Thyroid. His other physician recently checked a TSH as well as testosterone level. I requested a copy of that for our records.  He is due for a tetanus shot but she politely declines. He is also due for the shingles vaccine. He has never had a colonoscopy. Past Medical History:  Diagnosis Date  . Hypertension   . Hypothyroidism    Past Surgical History:  Procedure Laterality Date  . LUMBAR LAMINECTOMY/DECOMPRESSION MICRODISCECTOMY Right 11/17/2014   Procedure: CENTRAL DECOMPRESSIVE LUMBAR LAMINECTOMY AT L4-L5.  MICRODISCECTOMY AT L4-L5 ON RIGHT.  FORAMINOTOMY FOR THE L4 AND L5 NERVE ROOT ON THE RIGHT.;  Surgeon: Latanya Maudlin, MD;  Location: [facility];  Service: Orthopedics;  Laterality: Right;  . TONSILLECTOMY     Current Outpatient Prescriptions on File Prior to Visit  Medication Sig Dispense Refill  . ARMOUR THYROID 60 MG tablet Take 60 mg by mouth daily.  2  . minoxidil (LONITEN) 2.5 MG tablet Take 1.25 mg by mouth every evening.     No current facility-administered medications on file prior to visit.    No Known Allergies Social History   Social History  . Marital status: Single    Spouse name: N/A  . Number of children: N/A  . Years of education: N/A   Occupational History  . Not on file.   Social History Main Topics  . Smoking status: Never Smoker  . Smokeless tobacco: Never Used  . Alcohol use No  . Drug use: No  . Sexual activity: Not on file   Other Topics Concern  . Not on file   Social History Narrative  . No narrative on file   No family history on file. Mother has a history of congestive  heart failure and renal failure. Father has a history of hypertension   Review of Systems  All other systems reviewed and are negative.      Objective:   Physical Exam  Constitutional: He is oriented to person, place, and time. He appears well-developed and well-nourished. No distress.  HENT:  Head: Normocephalic and atraumatic.  Right Ear: External ear normal.  Left Ear: External ear normal.  Nose: Nose normal.  Mouth/Throat: Oropharynx is clear and moist. No oropharyngeal exudate.  Eyes: Conjunctivae and EOM are normal. Pupils are equal, round, and reactive to light. Right eye exhibits no discharge. Left eye exhibits no discharge. No scleral icterus.  Neck: Normal range of motion. Neck supple. No JVD present. No tracheal deviation present. No thyromegaly present.  Cardiovascular: Normal rate, regular rhythm, normal heart sounds and intact distal pulses.  Exam reveals no gallop and no friction rub.   No murmur heard. Pulmonary/Chest: Effort normal and breath sounds normal. No stridor. No respiratory distress. He has no wheezes. He has no rales. He exhibits no tenderness.  Abdominal: Soft. Bowel sounds are normal. He exhibits no distension and no mass. There is no tenderness. There is no rebound and no guarding.  Genitourinary: Rectum normal, prostate normal and penis normal. No penile tenderness.  Musculoskeletal: Normal range of motion. He exhibits no edema or tenderness.  Lymphadenopathy:    He has no  cervical adenopathy.  Neurological: He is alert and oriented to person, place, and time. He has normal reflexes. He displays normal reflexes. No cranial nerve deficit. He exhibits normal muscle tone. Coordination normal.  Skin: Skin is warm. No rash noted. He is not diaphoretic. No erythema. No pallor.  Psychiatric: He has a normal mood and affect. His behavior is normal. Judgment and thought content normal.  Vitals reviewed.         Assessment & Plan:  General medical exam -  Plan: CBC with Differential/Platelet, COMPLETE METABOLIC PANEL WITH GFR, Lipid panel, PSA  Colon cancer screening - Plan: Ambulatory referral to Gastroenterology  Hypogonadism in male  Physical exam today is normal. His blood pressure at home is well documented and found to be between 120 and 130/60-80. He received the first dose of Shingrix today.  He politely declined the tetanus shot. I will refer the patient for colonoscopy. I will check a CBC, CMP, fasting lipid panel, and a PSA. I will defer the management of his hypogonadism and his hypothyroidism to the doctor whom he has been seeing every 6 months for that. I did ask for a copy of his most recent testosterone level and TSH

## 2016-04-16 NOTE — Addendum Note (Signed)
Addended by: Shary Decamp B on: 04/16/2016 11:02 AM   Modules accepted: Orders

## 2016-04-17 LAB — PSA: PSA: 0.3 ng/mL (ref <=4.0)

## 2016-04-23 ENCOUNTER — Encounter (INDEPENDENT_AMBULATORY_CARE_PROVIDER_SITE_OTHER): Payer: Self-pay | Admitting: *Deleted

## 2016-05-03 ENCOUNTER — Other Ambulatory Visit (INDEPENDENT_AMBULATORY_CARE_PROVIDER_SITE_OTHER): Payer: Self-pay | Admitting: *Deleted

## 2016-05-03 DIAGNOSIS — Z1211 Encounter for screening for malignant neoplasm of colon: Secondary | ICD-10-CM

## 2016-07-16 ENCOUNTER — Other Ambulatory Visit: Payer: 59

## 2016-07-16 ENCOUNTER — Ambulatory Visit: Payer: 59

## 2016-08-03 ENCOUNTER — Encounter (INDEPENDENT_AMBULATORY_CARE_PROVIDER_SITE_OTHER): Payer: Self-pay | Admitting: *Deleted

## 2016-08-03 ENCOUNTER — Telehealth (INDEPENDENT_AMBULATORY_CARE_PROVIDER_SITE_OTHER): Payer: Self-pay | Admitting: *Deleted

## 2016-08-03 NOTE — Telephone Encounter (Signed)
Patient needs trilyte 

## 2016-08-06 MED ORDER — PEG 3350-KCL-NA BICARB-NACL 420 G PO SOLR: 4000.0000 mL | Freq: Once | ORAL | 0 refills | Status: AC

## 2016-08-16 ENCOUNTER — Telehealth (INDEPENDENT_AMBULATORY_CARE_PROVIDER_SITE_OTHER): Payer: Self-pay | Admitting: *Deleted

## 2016-08-16 NOTE — Telephone Encounter (Signed)
Referring MD/PCP: pickard   Procedure: tcs  Reason/Indication:  screening  Has patient had this procedure before?  no  If so, when, by whom and where?    Is there a family history of colon cancer?  no  Who?  What age when diagnosed?    Is patient diabetic?   no      Does patient have prosthetic heart valve or mechanical valve?  no  Do you have a pacemaker?  no  Has patient ever had endocarditis? no  Has patient had joint replacement within last 12 months?  no  Does patient tend to be constipated or take laxatives? no  Does patient have a history of alcohol/drug use?  no  Is patient on Coumadin, Plavix and/or Aspirin? no  Medications: armour thyroid 60 mg daily, minoxidil 2.5 mg 1/2 tab daily  Allergies: nkda  Medication Adjustment per Dr Laural Golden:   Procedure date & time: 09/12/16 at 830

## 2016-08-20 NOTE — Telephone Encounter (Signed)
agree

## 2016-08-28 ENCOUNTER — Ambulatory Visit (INDEPENDENT_AMBULATORY_CARE_PROVIDER_SITE_OTHER): Payer: 59

## 2016-08-28 DIAGNOSIS — Z23 Encounter for immunization: Secondary | ICD-10-CM | POA: Diagnosis not present

## 2016-08-28 NOTE — Progress Notes (Signed)
Patient was seen in office today for 2nd dose of shingrix vaccine. Patient received the vaccine in his right deltoid. Patient tolerated well

## 2016-09-12 ENCOUNTER — Ambulatory Visit (HOSPITAL_COMMUNITY)
Admission: RE | Admit: 2016-09-12 | Discharge: 2016-09-12 | Disposition: A | Discharge status: Home / Self Care | Payer: 59 | Source: Ambulatory Visit | Attending: Internal Medicine | Admitting: Internal Medicine

## 2016-09-12 ENCOUNTER — Encounter (HOSPITAL_COMMUNITY)
Admission: RE | Disposition: A | Discharge status: Home / Self Care | Payer: Self-pay | Source: Ambulatory Visit | Attending: Internal Medicine

## 2016-09-12 ENCOUNTER — Encounter (HOSPITAL_COMMUNITY): Payer: Self-pay | Admitting: *Deleted

## 2016-09-12 DIAGNOSIS — E039 Hypothyroidism, unspecified: Secondary | ICD-10-CM | POA: Insufficient documentation

## 2016-09-12 DIAGNOSIS — Z1211 Encounter for screening for malignant neoplasm of colon: Secondary | ICD-10-CM | POA: Diagnosis not present

## 2016-09-12 DIAGNOSIS — K644 Residual hemorrhoidal skin tags: Secondary | ICD-10-CM | POA: Insufficient documentation

## 2016-09-12 DIAGNOSIS — Z79899 Other long term (current) drug therapy: Secondary | ICD-10-CM | POA: Insufficient documentation

## 2016-09-12 DIAGNOSIS — E291 Testicular hypofunction: Secondary | ICD-10-CM | POA: Diagnosis not present

## 2016-09-12 HISTORY — PX: COLONOSCOPY: SHX5424

## 2016-09-12 SURGERY — COLONOSCOPY
Anesthesia: Moderate Sedation

## 2016-09-12 MED ORDER — MIDAZOLAM HCL 5 MG/5ML IJ SOLN
INTRAMUSCULAR | Status: DC | PRN
  Administered 2016-09-12: 2 mg via INTRAVENOUS
  Administered 2016-09-12: 3 mg via INTRAVENOUS
  Administered 2016-09-12 (×2): 2 mg via INTRAVENOUS
  Administered 2016-09-12: 1 mg via INTRAVENOUS

## 2016-09-12 MED ORDER — STERILE WATER FOR IRRIGATION IR SOLN
Status: DC | PRN
  Administered 2016-09-12: 2.5 mL

## 2016-09-12 MED ORDER — SODIUM CHLORIDE 0.9 % IV SOLN
INTRAVENOUS | Status: DC
  Administered 2016-09-12: 08:00:00 via INTRAVENOUS

## 2016-09-12 MED ORDER — MIDAZOLAM HCL 5 MG/5ML IJ SOLN
INTRAMUSCULAR | Status: AC
  Filled 2016-09-12: qty 10

## 2016-09-12 MED ORDER — MEPERIDINE HCL 50 MG/ML IJ SOLN
INTRAMUSCULAR | Status: AC
  Filled 2016-09-12: qty 1

## 2016-09-12 MED ORDER — MEPERIDINE HCL 50 MG/ML IJ SOLN
INTRAMUSCULAR | Status: DC | PRN
  Administered 2016-09-12 (×2): 25 mg via INTRAVENOUS

## 2016-09-12 NOTE — Op Note (Signed)
[facility] Patient Name: Brian Guzman Procedure Date: 09/12/2016 8:29 AM MRN: [id number] Date of Birth: Oct 25, 1965 Attending MD: [name] , MD CSN: [id number] Age: 51 Admit Type: Outpatient Procedure:                Colonoscopy Indications:              Screening for colorectal malignant neoplasm Providers:                [name], MD, Lurline Del, RN, Bonnetta Barry,                            Technician Referring MD:             Cammie Mcgee. Pickard, MD Medicines:                Meperidine 50 mg IV, Midazolam 10 mg IV Complications:            No immediate complications. Estimated Blood Loss:     Estimated blood loss: none. Procedure:                Pre-Anesthesia Assessment:                           - Prior to the procedure, a History and Physical                            was performed, and patient medications and                            allergies were reviewed. The patient's tolerance of                            previous anesthesia was also reviewed. The risks                            and benefits of the procedure and the sedation                            options and risks were discussed with the patient.                            All questions were answered, and informed consent                            was obtained. Prior Anticoagulants: The patient has                            taken no previous anticoagulant or antiplatelet                            agents. ASA Grade Assessment: I - A normal, healthy                            patient. After reviewing the risks and benefits,  the patient was deemed in satisfactory condition to                            undergo the procedure.                           After obtaining informed consent, the colonoscope                            was passed under direct vision. Throughout the                            procedure, the patient's blood pressure, pulse, and       oxygen saturations were monitored continuously. The                            [id number] ([id number]) scope was introduced through                            the anus and advanced to the the cecum, identified                            by appendiceal orifice and ileocecal valve. The                            colonoscopy was performed without difficulty. The                            patient tolerated the procedure well. The quality                            of the bowel preparation was excellent. The                            ileocecal valve, appendiceal orifice, and rectum                            were photographed. Scope In: 8:40:19 AM Scope Out: 9:00:01 AM Scope Withdrawal Time: 0 hours 8 minutes 19 seconds  Total Procedure Duration: 0 hours 19 minutes 42 seconds  Findings:      The perianal and digital rectal examinations were normal.      The colon (entire examined portion) appeared normal.      External hemorrhoids were found during retroflexion. The hemorrhoids       were small. Impression:               - The entire examined colon is normal.                           - External hemorrhoids.                           - No specimens collected. Moderate Sedation:      Moderate (conscious) sedation was administered by the endoscopy nurse       and supervised by the endoscopist. The  following parameters were       monitored: oxygen saturation, heart rate, blood pressure, CO2       capnography and response to care. Total physician intraservice time was       29 minutes. Recommendation:           - Patient has a contact number available for                            emergencies. The signs and symptoms of potential                            delayed complications were discussed with the                            patient. Return to normal activities tomorrow.                            Written discharge instructions were provided to the                            patient.                            - Resume previous diet today.                           - Continue present medications.                           - Repeat colonoscopy in 10 years for screening                            purposes. Procedure Code(s):        --- Professional ---                           [phone], Colonoscopy, flexible; diagnostic, including                            collection of specimen(s) by brushing or washing,                            when performed (separate procedure)                           99152, Moderate sedation services provided by the                            same physician or other qualified health care                            professional performing the diagnostic or                            therapeutic service that the sedation supports,  requiring the presence of an independent trained                            observer to assist in the monitoring of the                            patient's level of consciousness and physiological                            status; initial 15 minutes of intraservice time,                            patient age 49 years or older                           [phone], Moderate sedation services; each additional                            15 minutes intraservice time Diagnosis Code(s):        --- Professional ---                           Z12.11, Encounter for screening for malignant                            neoplasm of colon                           K64.4, Residual hemorrhoidal skin tags CPT copyright 2016 American Medical Association. All rights reserved. The codes documented in this report are preliminary and upon coder review may  be revised to meet current compliance requirements. [name], MD [name], MD 09/12/2016 9:08:50 AM This report has been signed electronically. Number of Addenda: 0

## 2016-09-12 NOTE — H&P (Signed)
Brian Guzman is an 51 y.o. male.   Chief Complaint: Patient is here for colonoscopy. HPI: patient is 51 year old Caucasian male who is here for screening colonoscopy. He denies abdominal pain change in bowel habits or rectal bleeding. Family history is negative ffor CRC.  Past Medical History:  Diagnosis Date  . Hypogonadism in male   . Hypothyroidism     Past Surgical History:  Procedure Laterality Date  . BREAST SURGERY     removed breast tissue  . COSMETIC SURGERY     gyecomastia reduction  . LUMBAR LAMINECTOMY/DECOMPRESSION MICRODISCECTOMY Right 11/17/2014   Procedure: CENTRAL DECOMPRESSIVE LUMBAR LAMINECTOMY AT L4-L5.  MICRODISCECTOMY AT L4-L5 ON RIGHT.  FORAMINOTOMY FOR THE L4 AND L5 NERVE ROOT ON THE RIGHT.;  Surgeon: Brian Maudlin, MD;  Location: [facility];  Service: Orthopedics;  Laterality: Right;  . TONSILLECTOMY      Family History  Problem Relation Age of Onset  . Hypertension Mother   . Kidney disease Mother   . Heart disease Mother   . Hypertension Father   . Colon cancer Neg Hx    Social History:  reports that he has never smoked. He has never used smokeless tobacco. He reports that he does not drink alcohol or use drugs.  Allergies: No Known Allergies  Medications Prior to Admission  Medication Sig Dispense Refill  . ARMOUR THYROID 60 MG tablet Take 60 mg by mouth daily.  2  . minoxidil (LONITEN) 2.5 MG tablet Take 1.25 mg by mouth every evening.      No results found for this or any previous visit (from the past 48 hour(s)). No results found.  ROS  Blood pressure (!) 140/94, pulse 89, temperature 99 F (37.2 C), temperature source Oral, resp. rate 12, height 5' 8.5" (1.74 m), weight 208 lb (94.3 kg), SpO2 99 %. Physical Exam  Constitutional: He appears well-developed and well-nourished.  HENT:  Mouth/Throat: Oropharynx is clear and moist.  Eyes: Conjunctivae are normal. No scleral icterus.  Neck: No thyromegaly present.  Cardiovascular: Normal  rate, regular rhythm and normal heart sounds.   No murmur heard. Respiratory: Effort normal and breath sounds normal.  GI: Soft. He exhibits no distension and no mass. There is no tenderness.  Musculoskeletal: He exhibits no edema.  Lymphadenopathy:    He has no cervical adenopathy.  Neurological: He is alert.  Skin: Skin is warm and dry.     Assessment/Plan Average risk screening colonoscopy.  [name], MD 09/12/2016, 8:25 AM

## 2016-09-12 NOTE — Discharge Instructions (Signed)
Resume usual medications and diet as before. No driving for 24 hours. Next screening examination in 10 years   Colonoscopy, Adult, Care After This sheet gives you information about how to care for yourself after your procedure. Your health care provider may also give you more specific instructions. If you have problems or questions, contact your health care provider. What can I expect after the procedure? After the procedure, it is common to have:  A small amount of blood in your stool for 24 hours after the procedure.  Some gas.  Mild abdominal cramping or bloating.  Follow these instructions at home: General instructions   For the first 24 hours after the procedure: ? Do not drive or use machinery. ? Do not sign important documents. ? Do not drink alcohol. ? Do your regular daily activities at a slower pace than normal. ? Eat soft, easy-to-digest foods. ? Rest often.  Take over-the-counter or prescription medicines only as told by your health care provider.  It is up to you to get the results of your procedure. Ask your health care provider, or the department performing the procedure, when your results will be ready. Relieving cramping and bloating  Try walking around when you have cramps or feel bloated.  Apply heat to your abdomen as told by your health care provider. Use a heat source that your health care provider recommends, such as a moist heat pack or a heating pad. ? Place a towel between your skin and the heat source. ? Leave the heat on for 20-30 minutes. ? Remove the heat if your skin turns bright red. This is especially important if you are unable to feel pain, heat, or cold. You may have a greater risk of getting burned. Eating and drinking  Drink enough fluid to keep your urine clear or pale yellow.  Resume your normal diet as instructed by your health care provider. Avoid heavy or fried foods that are hard to digest.  Avoid drinking alcohol for as long as  instructed by your health care provider. Contact a health care provider if:  You have blood in your stool 2-3 days after the procedure. Get help right away if:  You have more than a small spotting of blood in your stool.  You pass large blood clots in your stool.  Your abdomen is swollen.  You have nausea or vomiting.  You have a fever.  You have increasing abdominal pain that is not relieved with medicine. This information is not intended to replace advice given to you by your health care provider. Make sure you discuss any questions you have with your health care provider. Document Released: 08/02/2003 Document Revised: 09/12/2015 Document Reviewed: 03/01/2015 Elsevier Interactive Patient Education  2018 [employer].   Hemorrhoids Hemorrhoids are swollen veins in and around the rectum or anus. There are two types of hemorrhoids:  Internal hemorrhoids. These occur in the veins that are just inside the rectum. They may poke through to the outside and become irritated and painful.  External hemorrhoids. These occur in the veins that are outside of the anus and can be felt as a painful swelling or hard lump near the anus.  Most hemorrhoids do not cause serious problems, and they can be managed with home treatments such as diet and lifestyle changes. If home treatments do not help your symptoms, procedures can be done to shrink or remove the hemorrhoids. What are the causes? This condition is caused by increased pressure in the anal area. This  pressure may result from various things, including:  Constipation.  Straining to have a bowel movement.  Diarrhea.  Pregnancy.  Obesity.  Sitting for long periods of time.  Heavy lifting or other activity that causes you to strain.  Anal sex.  What are the signs or symptoms? Symptoms of this condition include:  Pain.  Anal itching or irritation.  Rectal bleeding.  Leakage of stool (feces).  Anal swelling.  One or  more lumps around the anus.  How is this diagnosed? This condition can often be diagnosed through a visual exam. Other exams or tests may also be done, such as:  Examination of the rectal area with a gloved hand (digital rectal exam).  Examination of the anal canal using a small tube (anoscope).  A blood test, if you have lost a significant amount of blood.  A test to look inside the colon (sigmoidoscopy or colonoscopy).  How is this treated? This condition can usually be treated at home. However, various procedures may be done if dietary changes, lifestyle changes, and other home treatments do not help your symptoms. These procedures can help make the hemorrhoids smaller or remove them completely. Some of these procedures involve surgery, and others do not. Common procedures include:  Rubber band ligation. Rubber bands are placed at the base of the hemorrhoids to cut off the blood supply to them.  Sclerotherapy. Medicine is injected into the hemorrhoids to shrink them.  Infrared coagulation. A type of light energy is used to get rid of the hemorrhoids.  Hemorrhoidectomy surgery. The hemorrhoids are surgically removed, and the veins that supply them are tied off.  Stapled hemorrhoidopexy surgery. A circular stapling device is used to remove the hemorrhoids and use staples to cut off the blood supply to them.  Follow these instructions at home: Eating and drinking  Eat foods that have a lot of fiber in them, such as whole grains, beans, nuts, fruits, and vegetables. Ask your health care provider about taking products that have added fiber (fiber supplements).  Drink enough fluid to keep your urine clear or pale yellow. Managing pain and swelling  Take warm sitz baths for 20 minutes, 3-4 times a day to ease pain and discomfort.  If directed, apply ice to the affected area. Using ice packs between sitz baths may be helpful. ? Put ice in a plastic bag. ? Place a towel between your  skin and the bag. ? Leave the ice on for 20 minutes, 2-3 times a day. General instructions  Take over-the-counter and prescription medicines only as told by your health care provider.  Use medicated creams or suppositories as told.  Exercise regularly.  Go to the bathroom when you have the urge to have a bowel movement. Do not wait.  Avoid straining to have bowel movements.  Keep the anal area dry and clean. Use wet toilet paper or moist towelettes after a bowel movement.  Do not sit on the toilet for long periods of time. This increases blood pooling and pain. Contact a health care provider if:  You have increasing pain and swelling that are not controlled by treatment or medicine.  You have uncontrolled bleeding.  You have difficulty having a bowel movement, or you are unable to have a bowel movement.  You have pain or inflammation outside the area of the hemorrhoids. This information is not intended to replace advice given to you by your health care provider. Make sure you discuss any questions you have with your health care  provider. Document Released: 12/16/1999 Document Revised: 05/18/2015 Document Reviewed: 09/01/2014 Elsevier Interactive Patient Education  2017 [employer].

## 2016-09-14 ENCOUNTER — Encounter (HOSPITAL_COMMUNITY): Payer: Self-pay | Admitting: Internal Medicine

## 2016-10-22 DIAGNOSIS — E349 Endocrine disorder, unspecified: Secondary | ICD-10-CM | POA: Diagnosis not present

## 2017-01-05 DIAGNOSIS — M545 Low back pain: Secondary | ICD-10-CM | POA: Diagnosis not present

## 2017-03-03 DIAGNOSIS — J029 Acute pharyngitis, unspecified: Secondary | ICD-10-CM | POA: Diagnosis not present

## 2017-03-06 ENCOUNTER — Other Ambulatory Visit: Payer: Self-pay | Admitting: Family Medicine

## 2017-03-06 DIAGNOSIS — K648 Other hemorrhoids: Secondary | ICD-10-CM

## 2017-03-06 MED ORDER — HYDROCORTISONE ACETATE 25 MG RE SUPP: 25.0000 mg | Freq: Two times a day (BID) | RECTAL | 2 refills | Status: DC

## 2017-04-08 DIAGNOSIS — E35 Disorders of endocrine glands in diseases classified elsewhere: Secondary | ICD-10-CM | POA: Diagnosis not present

## 2017-04-08 DIAGNOSIS — Z7689 Persons encountering health services in other specified circumstances: Secondary | ICD-10-CM | POA: Diagnosis not present

## 2017-04-16 ENCOUNTER — Other Ambulatory Visit: Payer: Self-pay | Admitting: Family Medicine

## 2017-04-16 DIAGNOSIS — Z0000 Encounter for general adult medical examination without abnormal findings: Secondary | ICD-10-CM

## 2017-04-17 ENCOUNTER — Other Ambulatory Visit: Payer: 59

## 2017-04-17 DIAGNOSIS — Z0000 Encounter for general adult medical examination without abnormal findings: Secondary | ICD-10-CM

## 2017-04-18 LAB — CBC WITH DIFFERENTIAL/PLATELET
BASOS PCT: 1.5 %
Basophils Absolute: 89 cells/uL (ref 0–200)
EOS ABS: 271 {cells}/uL (ref 15–500)
Eosinophils Relative: 4.6 %
HEMATOCRIT: 48.7 % (ref 38.5–50.0)
Hemoglobin: 17 g/dL (ref 13.2–17.1)
LYMPHS ABS: 2189 {cells}/uL (ref 850–3900)
MCH: 31 pg (ref 27.0–33.0)
MCHC: 34.9 g/dL (ref 32.0–36.0)
MCV: 88.9 fL (ref 80.0–100.0)
MPV: 12.5 fL (ref 7.5–12.5)
Monocytes Relative: 13.2 %
Neutro Abs: 2572 cells/uL (ref 1500–7800)
Neutrophils Relative %: 43.6 %
Platelets: 215 10*3/uL (ref 140–400)
RBC: 5.48 10*6/uL (ref 4.20–5.80)
RDW: 12.4 % (ref 11.0–15.0)
Total Lymphocyte: 37.1 %
WBC: 5.9 10*3/uL (ref 3.8–10.8)
WBCMIX: 779 {cells}/uL (ref 200–950)

## 2017-04-18 LAB — LIPID PANEL
Cholesterol: 142 mg/dL (ref <200)
HDL: 41 mg/dL (ref >40)
LDL Cholesterol (Calc): 83 mg/dL (calc)
NON-HDL CHOLESTEROL (CALC): 101 mg/dL (ref <130)
TRIGLYCERIDES: 89 mg/dL (ref <150)
Total CHOL/HDL Ratio: 3.5 (calc) (ref <5.0)

## 2017-04-18 LAB — COMPREHENSIVE METABOLIC PANEL
AG RATIO: 1.5 (calc) (ref 1.0–2.5)
ALBUMIN MSPROF: 4.4 g/dL (ref 3.6–5.1)
ALT: 19 U/L (ref 9–46)
AST: 14 U/L (ref 10–35)
Alkaline phosphatase (APISO): 73 U/L (ref 40–115)
BILIRUBIN TOTAL: 0.8 mg/dL (ref 0.2–1.2)
BUN: 13 mg/dL (ref 7–25)
CALCIUM: 9.3 mg/dL (ref 8.6–10.3)
CO2: 29 mmol/L (ref 20–32)
Chloride: 100 mmol/L (ref 98–110)
Creat: 0.93 mg/dL (ref 0.70–1.33)
GLOBULIN: 2.9 g/dL (ref 1.9–3.7)
GLUCOSE: 80 mg/dL (ref 65–99)
POTASSIUM: 4.6 mmol/L (ref 3.5–5.3)
SODIUM: 139 mmol/L (ref 135–146)
TOTAL PROTEIN: 7.3 g/dL (ref 6.1–8.1)

## 2017-04-22 ENCOUNTER — Ambulatory Visit (INDEPENDENT_AMBULATORY_CARE_PROVIDER_SITE_OTHER): Payer: 59 | Admitting: Family Medicine

## 2017-04-22 ENCOUNTER — Encounter: Payer: Self-pay | Admitting: Family Medicine

## 2017-04-22 VITALS — BP 158/90 | HR 115 | Temp 98.7°F | Resp 14 | Ht 69.0 in | Wt 200.0 lb

## 2017-04-22 DIAGNOSIS — Z23 Encounter for immunization: Secondary | ICD-10-CM | POA: Diagnosis not present

## 2017-04-22 DIAGNOSIS — Z0000 Encounter for general adult medical examination without abnormal findings: Secondary | ICD-10-CM

## 2017-04-22 DIAGNOSIS — R030 Elevated blood-pressure reading, without diagnosis of hypertension: Secondary | ICD-10-CM | POA: Diagnosis not present

## 2017-04-22 NOTE — Progress Notes (Signed)
Subjective:    Patient ID: Brian Guzman, male    DOB: November 06, 1965, 52 y.o.   MRN: [id number]  HPI Patient is here today for complete physical exam.  Past medical history significant for hypothyroidism as well as hypogonadism which is being treated by another physician. He is currently receiving the testosterone pellets implanted every 6-8 months. He is also on Armour Thyroid. His other physician recently checked a TSH as well as testosterone level.  On that lab work, T4 was 6.8, T3 was 155.  Testosterone level was 620.  PSA is 0.2.  Patient had colonoscopy in September of last year which was normal.  It was significant for only for internal hemorrhoids.  There were no polyps or cancer.  Patient is due for an HIV test which he politely declines due to lack of risk factors.  He is also due for a tetanus shot which she agrees to.  His blood pressure is elevated today however he has a documented history of whitecoat.  He checks his blood pressure every day at home and states that his average blood pressure is between 120 and 130/80-85 Past Medical History:  Diagnosis Date  . Hypogonadism in male   . Hypothyroidism   . White coat syndrome without diagnosis of hypertension    Past Surgical History:  Procedure Laterality Date  . BREAST SURGERY     removed breast tissue  . COLONOSCOPY N/A 09/12/2016   Procedure: COLONOSCOPY;  Surgeon: Rogene Houston, MD;  Location: [facility];  Service: Endoscopy;  Laterality: N/A;  830  . COSMETIC SURGERY     gyecomastia reduction  . LUMBAR LAMINECTOMY/DECOMPRESSION MICRODISCECTOMY Right 11/17/2014   Procedure: CENTRAL DECOMPRESSIVE LUMBAR LAMINECTOMY AT L4-L5.  MICRODISCECTOMY AT L4-L5 ON RIGHT.  FORAMINOTOMY FOR THE L4 AND L5 NERVE ROOT ON THE RIGHT.;  Surgeon: Latanya Maudlin, MD;  Location: [facility];  Service: Orthopedics;  Laterality: Right;  . TONSILLECTOMY     Current Outpatient Medications on File Prior to Visit  Medication Sig Dispense Refill  .  ARMOUR THYROID 60 MG tablet Take 60 mg by mouth daily.  2  . hydrocortisone (ANUSOL-HC) 25 MG suppository Place 1 suppository (25 mg total) rectally 2 (two) times daily. 12 suppository 2  . minoxidil (LONITEN) 2.5 MG tablet Take 1.25 mg by mouth every evening.     No current facility-administered medications on file prior to visit.    No Known Allergies Social History   Socioeconomic History  . Marital status: Single    Spouse name: Not on file  . Number of children: Not on file  . Years of education: Not on file  . Highest education level: Not on file  Occupational History  . Not on file  Social Needs  . Financial resource strain: Not on file  . Food insecurity:    Worry: Not on file    Inability: Not on file  . Transportation needs:    Medical: Not on file    Non-medical: Not on file  Tobacco Use  . Smoking status: Never Smoker  . Smokeless tobacco: Never Used  Substance and Sexual Activity  . Alcohol use: No  . Drug use: No  . Sexual activity: Not on file  Lifestyle  . Physical activity:    Days per week: Not on file    Minutes per session: Not on file  . Stress: Not on file  Relationships  . Social connections:    Talks on phone: Not on file  Gets together: Not on file    Attends religious service: Not on file    Active member of club or organization: Not on file    Attends meetings of clubs or organizations: Not on file    Relationship status: Not on file  . Intimate partner violence:    Fear of current or ex partner: Not on file    Emotionally abused: Not on file    Physically abused: Not on file    Forced sexual activity: Not on file  Other Topics Concern  . Not on file  Social History Narrative  . Not on file   Family History  Problem Relation Age of Onset  . Hypertension Mother   . Kidney disease Mother   . Heart disease Mother   . Hypertension Father   . Colon cancer Neg Hx    Mother has a history of congestive heart failure and renal  failure. Father has a history of hypertension   Review of Systems  All other systems reviewed and are negative.      Objective:   Physical Exam  Constitutional: He is oriented to person, place, and time. He appears well-developed and well-nourished. No distress.  HENT:  Head: Normocephalic and atraumatic.  Right Ear: External ear normal.  Left Ear: External ear normal.  Nose: Nose normal.  Mouth/Throat: Oropharynx is clear and moist. No oropharyngeal exudate.  Eyes: Pupils are equal, round, and reactive to light. Conjunctivae and EOM are normal. Right eye exhibits no discharge. Left eye exhibits no discharge. No scleral icterus.  Neck: Normal range of motion. Neck supple. No JVD present. No tracheal deviation present. No thyromegaly present.  Cardiovascular: Normal rate, regular rhythm, normal heart sounds and intact distal pulses. Exam reveals no gallop and no friction rub.  No murmur heard. Pulmonary/Chest: Effort normal and breath sounds normal. No stridor. No respiratory distress. He has no wheezes. He has no rales. He exhibits no tenderness.  Abdominal: Soft. Bowel sounds are normal. He exhibits no distension and no mass. There is no tenderness. There is no rebound and no guarding.  Musculoskeletal: Normal range of motion. He exhibits no edema or tenderness.  Lymphadenopathy:    He has no cervical adenopathy.  Neurological: He is alert and oriented to person, place, and time. He has normal reflexes. No cranial nerve deficit. He exhibits normal muscle tone. Coordination normal.  Skin: Skin is warm. No rash noted. He is not diaphoretic. No erythema. No pallor.  Psychiatric: He has a normal mood and affect. His behavior is normal. Judgment and thought content normal.  Vitals reviewed.         Assessment & Plan:  General medical exam  White coat syndrome without diagnosis of hypertension  Physical exam today is completely normal.  I will make no changes in his medical  treatment.  Colon cancer screening is up-to-date.  Prostate cancer screening is up-to-date.  His thyroid and testosterone levels are monitored by his endocrinologist.  The remainder of his lab work as listed below: Appointment on 04/17/2017  Component Date Value Ref Range Status  . WBC 04/17/2017 5.9  3.8 - 10.8 Thousand/uL Final  . RBC 04/17/2017 5.48  4.20 - 5.80 Million/uL Final  . Hemoglobin 04/17/2017 17.0  13.2 - 17.1 g/dL Final  . HCT 04/17/2017 48.7  38.5 - 50.0 % Final  . MCV 04/17/2017 88.9  80.0 - 100.0 fL Final  . MCH 04/17/2017 31.0  27.0 - 33.0 pg Final  . MCHC 04/17/2017 34.9  32.0 - 36.0 g/dL Final  . RDW 04/17/2017 12.4  11.0 - 15.0 % Final  . Platelets 04/17/2017 215  140 - 400 Thousand/uL Final  . MPV 04/17/2017 12.5  7.5 - 12.5 fL Final  . Neutro Abs 04/17/2017 2,572  1,500 - 7,800 cells/uL Final  . Lymphs Abs 04/17/2017 2,189  850 - 3,900 cells/uL Final  . WBC mixed population 04/17/2017 779  200 - 950 cells/uL Final  . Eosinophils Absolute 04/17/2017 271  15 - 500 cells/uL Final  . Basophils Absolute 04/17/2017 89  0 - 200 cells/uL Final  . Neutrophils Relative % 04/17/2017 43.6  % Final  . Total Lymphocyte 04/17/2017 37.1  % Final  . Monocytes Relative 04/17/2017 13.2  % Final  . Eosinophils Relative 04/17/2017 4.6  % Final  . Basophils Relative 04/17/2017 1.5  % Final  . Glucose, Bld 04/17/2017 80  65 - 99 mg/dL Final   Comment: .            Fasting reference interval .   . BUN 04/17/2017 13  7 - 25 mg/dL Final  . Creat 04/17/2017 0.93  0.70 - 1.33 mg/dL Final   Comment: For patients >87 years of age, the reference limit for Creatinine is approximately 13% higher for people identified as African-American. .   Havery Moros Ratio 76/73/4193 NOT APPLICABLE  6 - 22 (calc) Final  . Sodium 04/17/2017 139  135 - 146 mmol/L Final  . Potassium 04/17/2017 4.6  3.5 - 5.3 mmol/L Final  . Chloride 04/17/2017 100  98 - 110 mmol/L Final  . CO2 04/17/2017 29  20 -  32 mmol/L Final  . Calcium 04/17/2017 9.3  8.6 - 10.3 mg/dL Final  . Total Protein 04/17/2017 7.3  6.1 - 8.1 g/dL Final  . Albumin 04/17/2017 4.4  3.6 - 5.1 g/dL Final  . Globulin 04/17/2017 2.9  1.9 - 3.7 g/dL (calc) Final  . AG Ratio 04/17/2017 1.5  1.0 - 2.5 (calc) Final  . Total Bilirubin 04/17/2017 0.8  0.2 - 1.2 mg/dL Final  . Alkaline phosphatase (APISO) 04/17/2017 73  40 - 115 U/L Final  . AST 04/17/2017 14  10 - 35 U/L Final  . ALT 04/17/2017 19  9 - 46 U/L Final  . Cholesterol 04/17/2017 142  <200 mg/dL Final  . HDL 04/17/2017 41  >40 mg/dL Final  . Triglycerides 04/17/2017 89  <150 mg/dL Final  . LDL Cholesterol (Calc) 04/17/2017 83  mg/dL (calc) Final   Comment: Reference range: <100 . Desirable range <100 mg/dL for primary prevention;   <70 mg/dL for patients with CHD or diabetic patients  with > or = 2 CHD risk factors. Marland Kitchen LDL-C is now calculated using the Martin-Hopkins  calculation, which is a validated novel method providing  better accuracy than the Friedewald equation in the  estimation of LDL-C.  Cresenciano Genre et al. Annamaria Helling. 7902;409(73): 2061-2068  (http://education.[facility].com/faq/FAQ164)   . Total CHOL/HDL Ratio 04/17/2017 3.5  <5.0 (calc) Final  . Non-HDL Cholesterol (Calc) 04/17/2017 101  <130 mg/dL (calc) Final   Comment: For patients with diabetes plus 1 major ASCVD risk  factor, treating to a non-HDL-C goal of <100 mg/dL  (LDL-C of <70 mg/dL) is considered a therapeutic  option.    10-year risk for coronary artery disease is 3.3% and therefore the patient does not require statin.  He received his tetanus shot today.  Recheck in 1 year or as needed

## 2017-04-22 NOTE — Addendum Note (Signed)
Addended by: Shary Decamp B on: 04/22/2017 03:53 PM   Modules accepted: Orders

## 2017-06-04 DIAGNOSIS — L255 Unspecified contact dermatitis due to plants, except food: Secondary | ICD-10-CM | POA: Diagnosis not present

## 2017-08-05 DIAGNOSIS — R891 Abnormal level of hormones in specimens from other organs, systems and tissues: Secondary | ICD-10-CM | POA: Diagnosis not present

## 2017-08-05 DIAGNOSIS — R5383 Other fatigue: Secondary | ICD-10-CM | POA: Diagnosis not present

## 2017-09-16 ENCOUNTER — Ambulatory Visit: Payer: 59 | Admitting: Family Medicine

## 2017-09-16 ENCOUNTER — Encounter: Payer: Self-pay | Admitting: Family Medicine

## 2017-09-16 VITALS — BP 150/100 | HR 86 | Temp 98.5°F | Resp 18 | Ht 69.0 in | Wt 217.0 lb

## 2017-09-16 DIAGNOSIS — R030 Elevated blood-pressure reading, without diagnosis of hypertension: Secondary | ICD-10-CM

## 2017-09-16 DIAGNOSIS — R002 Palpitations: Secondary | ICD-10-CM

## 2017-09-16 NOTE — Progress Notes (Signed)
Subjective:    Patient ID: Brian Guzman, male    DOB: March 10, 1965, 52 y.o.   MRN: [id number]  HPI Patient went for hearing test at work.  The nurse that his job was checking his blood pressure and found it to be borderline elevated at 140/90.  She also appreciated an irregular heartbeat and referred him back to me.  Patient denies any chest pain.  He denies any palpitations.  He denies any syncope or near syncope.  He denies any shortness of breath, dyspnea on exertion, orthopnea, paroxysmal nocturnal dyspnea.  On examination today, the patient has an occasional skipped beat which occurs irregularly but appears to be in normal sinus rhythm.  His blood pressure however is extremely high as it was at his physical however the patient has documented whitecoat syndrome and has brought by his blood pressure cuff to allow [place] to verify its accuracy.  His blood pressure cuff has been verified to be accurate and his blood pressure at home typically runs 120-130 over 70s.  Therefore I believe his elevated blood pressure here is likely whitecoat syndrome. Past Medical History:  Diagnosis Date  . Hypogonadism in male   . Hypothyroidism   . White coat syndrome without diagnosis of hypertension    Past Surgical History:  Procedure Laterality Date  . BREAST SURGERY     removed breast tissue  . COLONOSCOPY N/A 09/12/2016   Procedure: COLONOSCOPY;  Surgeon: Rogene Houston, MD;  Location: [facility];  Service: Endoscopy;  Laterality: N/A;  830  . COSMETIC SURGERY     gyecomastia reduction  . LUMBAR LAMINECTOMY/DECOMPRESSION MICRODISCECTOMY Right 11/17/2014   Procedure: CENTRAL DECOMPRESSIVE LUMBAR LAMINECTOMY AT L4-L5.  MICRODISCECTOMY AT L4-L5 ON RIGHT.  FORAMINOTOMY FOR THE L4 AND L5 NERVE ROOT ON THE RIGHT.;  Surgeon: Latanya Maudlin, MD;  Location: [facility];  Service: Orthopedics;  Laterality: Right;  . TONSILLECTOMY     Current Outpatient Medications on File Prior to Visit  Medication Sig Dispense  Refill  . ARMOUR THYROID 60 MG tablet Take 60 mg by mouth daily.  2  . hydrocortisone (ANUSOL-HC) 25 MG suppository Place 1 suppository (25 mg total) rectally 2 (two) times daily. 12 suppository 2  . minoxidil (LONITEN) 2.5 MG tablet Take 1.25 mg by mouth every evening.    . finasteride (PROSCAR) 5 MG tablet      No current facility-administered medications on file prior to visit.    No Known Allergies Social History   Socioeconomic History  . Marital status: Single    Spouse name: Not on file  . Number of children: Not on file  . Years of education: Not on file  . Highest education level: Not on file  Occupational History  . Not on file  Social Needs  . Financial resource strain: Not on file  . Food insecurity:    Worry: Not on file    Inability: Not on file  . Transportation needs:    Medical: Not on file    Non-medical: Not on file  Tobacco Use  . Smoking status: Never Smoker  . Smokeless tobacco: Never Used  Substance and Sexual Activity  . Alcohol use: No  . Drug use: No  . Sexual activity: Not on file  Lifestyle  . Physical activity:    Days per week: Not on file    Minutes per session: Not on file  . Stress: Not on file  Relationships  . Social connections:    Talks on phone:  Not on file    Gets together: Not on file    Attends religious service: Not on file    Active member of club or organization: Not on file    Attends meetings of clubs or organizations: Not on file    Relationship status: Not on file  . Intimate partner violence:    Fear of current or ex partner: Not on file    Emotionally abused: Not on file    Physically abused: Not on file    Forced sexual activity: Not on file  Other Topics Concern  . Not on file  Social History Narrative  . Not on file      Review of Systems  All other systems reviewed and are negative.      Objective:   Physical Exam  Neck: No JVD present. No thyromegaly present.  Cardiovascular: Normal rate,  regular rhythm and normal heart sounds. Exam reveals no gallop and no friction rub.  No murmur heard. Pulmonary/Chest: Effort normal and breath sounds normal. No stridor. No respiratory distress. He has no wheezes. He has no rales.  Musculoskeletal: He exhibits no edema.  Vitals reviewed.         Assessment & Plan:  Palpitations - Plan: EKG 80-SUPJ, BASIC METABOLIC PANEL WITH GFR, TSH  White coat syndrome without diagnosis of hypertension  EKG shows normal sinus rhythm with no evidence of ischemia or infarction.  However it does capture an isolated PVC.  I believe that the irregular heartbeat the nurse was appreciating on her exam was likely PVCs.  Patient has been under more stress recently at work and he believes this could be contributing to it.  He is also trying to cut back on his caffeine.  I will check a CMP to monitor it for any letter light disturbances that can trigger this along with a TSH.  Otherwise, I reassured the patient that an occasional PVC is not dangerous and as long as he is not having any symptoms does not require treatment.  I did ask him to check his blood pressure frequently at home, morning, noon, and evening.  If the majority are greater than 140/90 it would warrant treatment.  We could consider a low-dose beta-blocker such as Toprol-XL to treat his blood pressure and prevent the PVCs if necessary.

## 2017-09-17 LAB — BASIC METABOLIC PANEL WITH GFR
BUN: 10 mg/dL (ref 7–25)
CALCIUM: 9.3 mg/dL (ref 8.6–10.3)
CHLORIDE: 102 mmol/L (ref 98–110)
CO2: 27 mmol/L (ref 20–32)
Creat: 0.8 mg/dL (ref 0.70–1.33)
GFR, EST NON AFRICAN AMERICAN: 103 mL/min/{1.73_m2} (ref >=60)
GFR, Est African American: 119 mL/min/{1.73_m2} (ref >=60)
Glucose, Bld: 82 mg/dL (ref 65–99)
POTASSIUM: 4 mmol/L (ref 3.5–5.3)
Sodium: 141 mmol/L (ref 135–146)

## 2017-09-17 LAB — TSH: TSH: 1.33 mIU/L (ref 0.40–4.50)

## 2018-02-12 ENCOUNTER — Encounter: Payer: Self-pay | Admitting: Podiatry

## 2018-02-12 ENCOUNTER — Ambulatory Visit: Payer: 59 | Admitting: Podiatry

## 2018-02-12 VITALS — BP 156/99 | HR 103 | Resp 16

## 2018-02-12 DIAGNOSIS — L600 Ingrowing nail: Secondary | ICD-10-CM

## 2018-02-12 DIAGNOSIS — M779 Enthesopathy, unspecified: Secondary | ICD-10-CM | POA: Diagnosis not present

## 2018-02-12 DIAGNOSIS — Q828 Other specified congenital malformations of skin: Secondary | ICD-10-CM

## 2018-02-12 MED ORDER — TRIAMCINOLONE ACETONIDE 10 MG/ML IJ SUSP
10.0000 mg | Freq: Once | INTRAMUSCULAR | Status: AC
  Administered 2018-02-12: 10 mg

## 2018-02-12 NOTE — Progress Notes (Signed)
   Subjective:    Patient ID: Brian Guzman, male    DOB: [birth year], 53 y.o.   MRN: [id number]  HPI    Review of Systems  All other systems reviewed and are negative.      Objective:   Physical Exam        Assessment & Plan:

## 2018-02-14 NOTE — Progress Notes (Signed)
Subjective:   Patient ID: Brian Guzman, male   DOB: 53 y.o.   MRN: [id number]   HPI Patient presents stating that he has inflammation on the sides of his bones of both feet that are sore and he develops lesions that make it hard to walk.  States is been going on for a long time and is worsened over the last few months and patient states he tried to trim them himself without relief of symptoms.  Patient does not smoke likes to be active   Review of Systems  All other systems reviewed and are negative.       Objective:  Physical Exam Vitals signs and nursing note reviewed.  Constitutional:      Appearance: He is well-developed.  Pulmonary:     Effort: Pulmonary effort is normal.  Musculoskeletal: Normal range of motion.  Skin:    General: Skin is warm.  Neurological:     Mental Status: He is alert.     Neurovascular status intact muscle strength is adequate range of motion within normal limits with patient noted to have deep keratotic lesion sub-fifth metatarsal bilateral with porokeratotic cores that are painful when pressed.  There is inflammation fluid around the fifth MPJ bilateral and they are gradually becoming more discomforting as he tries to walk and be active.  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  I did do sterile prep and then injected the fifth MPJ capsule bilateral with 3 mg Dexasone Kenalog 5 mg Xylocaine used sterile instrumentation and did debridement of the lesions taking out the corners and applied sterile dressings.  Gave him advice that these will recur and we will reevaluate and may require more aggressive treatment at one point in future  X-ray indicates that there is no indications of pathology associated with these from the  standpoint of the large metatarsals

## 2018-07-15 ENCOUNTER — Other Ambulatory Visit: Payer: Self-pay

## 2018-07-15 ENCOUNTER — Other Ambulatory Visit: Payer: 59

## 2018-07-15 DIAGNOSIS — Z0000 Encounter for general adult medical examination without abnormal findings: Secondary | ICD-10-CM

## 2018-07-17 ENCOUNTER — Other Ambulatory Visit: Payer: Self-pay

## 2018-07-17 ENCOUNTER — Encounter: Payer: Self-pay | Admitting: Family Medicine

## 2018-07-17 ENCOUNTER — Ambulatory Visit (INDEPENDENT_AMBULATORY_CARE_PROVIDER_SITE_OTHER): Payer: 59 | Admitting: Family Medicine

## 2018-07-17 VITALS — BP 120/62 | HR 102 | Temp 98.7°F | Resp 16 | Ht 69.0 in | Wt 209.0 lb

## 2018-07-17 DIAGNOSIS — Z0000 Encounter for general adult medical examination without abnormal findings: Secondary | ICD-10-CM

## 2018-07-17 LAB — COMPREHENSIVE METABOLIC PANEL
AG Ratio: 1.4 (calc) (ref 1.0–2.5)
ALT: 22 U/L (ref 9–46)
AST: 14 U/L (ref 10–35)
Albumin: 4.2 g/dL (ref 3.6–5.1)
Alkaline phosphatase (APISO): 65 U/L (ref 35–144)
BUN: 11 mg/dL (ref 7–25)
CO2: 29 mmol/L (ref 20–32)
Calcium: 9.3 mg/dL (ref 8.6–10.3)
Chloride: 101 mmol/L (ref 98–110)
Creat: 0.8 mg/dL (ref 0.70–1.33)
Globulin: 2.9 g/dL (calc) (ref 1.9–3.7)
Glucose, Bld: 97 mg/dL (ref 65–99)
Potassium: 4.1 mmol/L (ref 3.5–5.3)
Sodium: 137 mmol/L (ref 135–146)
Total Bilirubin: 0.9 mg/dL (ref 0.2–1.2)
Total Protein: 7.1 g/dL (ref 6.1–8.1)

## 2018-07-17 LAB — PSA: PSA: 0.3 ng/mL (ref <=4.0)

## 2018-07-17 LAB — LIPID PANEL
Cholesterol: 167 mg/dL (ref <200)
HDL: 35 mg/dL — ABNORMAL LOW (ref >=40)
LDL Cholesterol (Calc): 115 mg/dL (calc) — ABNORMAL HIGH
Non-HDL Cholesterol (Calc): 132 mg/dL (calc) — ABNORMAL HIGH (ref <130)
Total CHOL/HDL Ratio: 4.8 (calc) (ref <5.0)
Triglycerides: 78 mg/dL (ref <150)

## 2018-07-17 LAB — CBC WITH DIFFERENTIAL/PLATELET

## 2018-07-17 NOTE — Progress Notes (Signed)
Subjective:    Patient ID: Brian Guzman, male    DOB: 1965/01/30, 53 y.o.   MRN: [id number]  HPI Patient is here today for complete physical exam.  Past medical history significant for hypothyroidism as well as hypogonadism which is being treated by another physician. Colonoscopy was completely normal in 2018.  Patient sees another physician who monitors his thyroid and also checks his testosterone.  This physician is supplementing his testosterone with testosterone pellets.  Patient denies any medical concerns.  He recently has been having occasional pains in his lower back around the level of L4-L5.  If he bends the wrong direction he will feel sharp pain on the right side of his spine.  It would last just a split second and then subside.  He denies any lumbar radiculopathy.  He denies any leg weakness.  He denies any saddle anesthesia.  He denies any blood in his stool.  He denies any melena or hematochezia.  He denies any dysuria, urgency, frequency, or hematuria.  The remainder of his review of systems is negative. Lab on 07/15/2018  Component Date Value Ref Range Status  . Glucose, Bld 07/15/2018 97  65 - 99 mg/dL Final   Comment: .            Fasting reference interval .   . BUN 07/15/2018 11  7 - 25 mg/dL Final  . Creat 07/15/2018 0.80  0.70 - 1.33 mg/dL Final   Comment: For patients >43 years of age, the reference limit for Creatinine is approximately 13% higher for people identified as African-American. .   Havery Moros Ratio 07/37/1062 NOT APPLICABLE  6 - 22 (calc) Final  . Sodium 07/15/2018 137  135 - 146 mmol/L Final  . Potassium 07/15/2018 4.1  3.5 - 5.3 mmol/L Final  . Chloride 07/15/2018 101  98 - 110 mmol/L Final  . CO2 07/15/2018 29  20 - 32 mmol/L Final  . Calcium 07/15/2018 9.3  8.6 - 10.3 mg/dL Final  . Total Protein 07/15/2018 7.1  6.1 - 8.1 g/dL Final  . Albumin 07/15/2018 4.2  3.6 - 5.1 g/dL Final  . Globulin 07/15/2018 2.9  1.9 - 3.7 g/dL (calc) Final   . AG Ratio 07/15/2018 1.4  1.0 - 2.5 (calc) Final  . Total Bilirubin 07/15/2018 0.9  0.2 - 1.2 mg/dL Final  . Alkaline phosphatase (APISO) 07/15/2018 65  35 - 144 U/L Final  . AST 07/15/2018 14  10 - 35 U/L Final  . ALT 07/15/2018 22  9 - 46 U/L Final  . Cholesterol 07/15/2018 167  <200 mg/dL Final  . HDL 07/15/2018 35* > OR = 40 mg/dL Final  . Triglycerides 07/15/2018 78  <150 mg/dL Final  . LDL Cholesterol (Calc) 07/15/2018 115* mg/dL (calc) Final   Comment: Reference range: <100 . Desirable range <100 mg/dL for primary prevention;   <70 mg/dL for patients with CHD or diabetic patients  with > or = 2 CHD risk factors. Marland Kitchen LDL-C is now calculated using the Martin-Hopkins  calculation, which is a validated novel method providing  better accuracy than the Friedewald equation in the  estimation of LDL-C.  Cresenciano Genre et al. Annamaria Helling. 6948;546(27): 2061-2068  (http://education.[facility].com/faq/FAQ164)   . Total CHOL/HDL Ratio 07/15/2018 4.8  <5.0 (calc) Final  . Non-HDL Cholesterol (Calc) 07/15/2018 132* <130 mg/dL (calc) Final   Comment: For patients with diabetes plus 1 major ASCVD risk  factor, treating to a non-HDL-C goal of <100 mg/dL  (LDL-C of <70 mg/dL)  is considered a therapeutic  option.   Marland Kitchen PSA 07/15/2018 0.3  < OR = 4.0 ng/mL Final   Comment: The total PSA value from this assay system is  standardized against the WHO standard. The test  result will be approximately 20% lower when compared  to the equimolar-standardized total PSA (Beckman  Coulter). Comparison of serial PSA results should be  interpreted with this fact in mind. . This test was performed using the Siemens  chemiluminescent method. Values obtained from  different assay methods cannot be used interchangeably. PSA levels, regardless of value, should not be interpreted as absolute evidence of the presence or absence of disease.    Immunization History  Administered Date(s) Administered  . Tdap  04/22/2017  . Zoster Recombinat (Shingrix) 04/16/2016, 08/28/2016   Past Medical History:  Diagnosis Date  . Hypogonadism in male   . Hypothyroidism   . White coat syndrome without diagnosis of hypertension    Past Surgical History:  Procedure Laterality Date  . BREAST SURGERY     removed breast tissue  . COLONOSCOPY N/A 09/12/2016   Procedure: COLONOSCOPY;  Surgeon: Rogene Houston, MD;  Location: [facility];  Service: Endoscopy;  Laterality: N/A;  830  . COSMETIC SURGERY     gyecomastia reduction  . LUMBAR LAMINECTOMY/DECOMPRESSION MICRODISCECTOMY Right 11/17/2014   Procedure: CENTRAL DECOMPRESSIVE LUMBAR LAMINECTOMY AT L4-L5.  MICRODISCECTOMY AT L4-L5 ON RIGHT.  FORAMINOTOMY FOR THE L4 AND L5 NERVE ROOT ON THE RIGHT.;  Surgeon: Latanya Maudlin, MD;  Location: [facility];  Service: Orthopedics;  Laterality: Right;  . TONSILLECTOMY     Current Outpatient Medications on File Prior to Visit  Medication Sig Dispense Refill  . ARMOUR THYROID 60 MG tablet Take 60 mg by mouth daily.  2  . finasteride (PROSCAR) 5 MG tablet     . hydrocortisone (ANUSOL-HC) 25 MG suppository Place 1 suppository (25 mg total) rectally 2 (two) times daily. 12 suppository 2  . minoxidil (LONITEN) 2.5 MG tablet Take 1.25 mg by mouth every evening.     No current facility-administered medications on file prior to visit.    No Known Allergies Social History   Socioeconomic History  . Marital status: Single    Spouse name: Not on file  . Number of children: Not on file  . Years of education: Not on file  . Highest education level: Not on file  Occupational History  . Not on file  Social Needs  . Financial resource strain: Not on file  . Food insecurity    Worry: Not on file    Inability: Not on file  . Transportation needs    Medical: Not on file    Non-medical: Not on file  Tobacco Use  . Smoking status: Never Smoker  . Smokeless tobacco: Never Used  Substance and Sexual Activity  . Alcohol use:  No  . Drug use: No  . Sexual activity: Not on file  Lifestyle  . Physical activity    Days per week: Not on file    Minutes per session: Not on file  . Stress: Not on file  Relationships  . Social Herbalist on phone: Not on file    Gets together: Not on file    Attends religious service: Not on file    Active member of club or organization: Not on file    Attends meetings of clubs or organizations: Not on file    Relationship status: Not on file  . Intimate  partner violence    Fear of current or ex partner: Not on file    Emotionally abused: Not on file    Physically abused: Not on file    Forced sexual activity: Not on file  Other Topics Concern  . Not on file  Social History Narrative  . Not on file   Family History  Problem Relation Age of Onset  . Hypertension Mother   . Kidney disease Mother   . Heart disease Mother   . Hypertension Father   . Colon cancer Neg Hx    Mother has a history of congestive heart failure and renal failure. Father has a history of hypertension   Review of Systems  All other systems reviewed and are negative.      Objective:   Physical Exam  Constitutional: He is oriented to person, place, and time. He appears well-developed and well-nourished. No distress.  HENT:  Head: Normocephalic and atraumatic.  Right Ear: External ear normal.  Left Ear: External ear normal.  Nose: Nose normal.  Mouth/Throat: Oropharynx is clear and moist. No oropharyngeal exudate.  Eyes: Pupils are equal, round, and reactive to light. Conjunctivae and EOM are normal. Right eye exhibits no discharge. Left eye exhibits no discharge. No scleral icterus.  Neck: Normal range of motion. Neck supple. No JVD present. No tracheal deviation present. No thyromegaly present.  Cardiovascular: Normal rate, regular rhythm, normal heart sounds and intact distal pulses. Exam reveals no gallop and no friction rub.  No murmur heard. Pulmonary/Chest: Effort normal and  breath sounds normal. No stridor. No respiratory distress. He has no wheezes. He has no rales. He exhibits no tenderness.  Abdominal: Soft. Bowel sounds are normal. He exhibits no distension and no mass. There is no abdominal tenderness. There is no rebound and no guarding.  Musculoskeletal: Normal range of motion.        General: No tenderness or edema.  Lymphadenopathy:    He has no cervical adenopathy.  Neurological: He is alert and oriented to person, place, and time. He has normal reflexes. No cranial nerve deficit. He exhibits normal muscle tone. Coordination normal.  Skin: Skin is warm. No rash noted. He is not diaphoretic. No erythema. No pallor.  Psychiatric: He has a normal mood and affect. His behavior is normal. Judgment and thought content normal.  Vitals reviewed.         Assessment & Plan:    Lab on 07/15/2018  Component Date Value Ref Range Status  . Glucose, Bld 07/15/2018 97  65 - 99 mg/dL Final   Comment: .            Fasting reference interval .   . BUN 07/15/2018 11  7 - 25 mg/dL Final  . Creat 07/15/2018 0.80  0.70 - 1.33 mg/dL Final   Comment: For patients >56 years of age, the reference limit for Creatinine is approximately 13% higher for people identified as African-American. .   Havery Moros Ratio 05/39/7673 NOT APPLICABLE  6 - 22 (calc) Final  . Sodium 07/15/2018 137  135 - 146 mmol/L Final  . Potassium 07/15/2018 4.1  3.5 - 5.3 mmol/L Final  . Chloride 07/15/2018 101  98 - 110 mmol/L Final  . CO2 07/15/2018 29  20 - 32 mmol/L Final  . Calcium 07/15/2018 9.3  8.6 - 10.3 mg/dL Final  . Total Protein 07/15/2018 7.1  6.1 - 8.1 g/dL Final  . Albumin 07/15/2018 4.2  3.6 - 5.1 g/dL Final  . Globulin 07/15/2018  2.9  1.9 - 3.7 g/dL (calc) Final  . AG Ratio 07/15/2018 1.4  1.0 - 2.5 (calc) Final  . Total Bilirubin 07/15/2018 0.9  0.2 - 1.2 mg/dL Final  . Alkaline phosphatase (APISO) 07/15/2018 65  35 - 144 U/L Final  . AST 07/15/2018 14  10 - 35 U/L  Final  . ALT 07/15/2018 22  9 - 46 U/L Final  . Cholesterol 07/15/2018 167  <200 mg/dL Final  . HDL 07/15/2018 35* > OR = 40 mg/dL Final  . Triglycerides 07/15/2018 78  <150 mg/dL Final  . LDL Cholesterol (Calc) 07/15/2018 115* mg/dL (calc) Final   Comment: Reference range: <100 . Desirable range <100 mg/dL for primary prevention;   <70 mg/dL for patients with CHD or diabetic patients  with > or = 2 CHD risk factors. Marland Kitchen LDL-C is now calculated using the Martin-Hopkins  calculation, which is a validated novel method providing  better accuracy than the Friedewald equation in the  estimation of LDL-C.  Cresenciano Genre et al. Annamaria Helling. 1914;782(95): 2061-2068  (http://education.[facility].com/faq/FAQ164)   . Total CHOL/HDL Ratio 07/15/2018 4.8  <5.0 (calc) Final  . Non-HDL Cholesterol (Calc) 07/15/2018 132* <130 mg/dL (calc) Final   Comment: For patients with diabetes plus 1 major ASCVD risk  factor, treating to a non-HDL-C goal of <100 mg/dL  (LDL-C of <70 mg/dL) is considered a therapeutic  option.   Marland Kitchen PSA 07/15/2018 0.3  < OR = 4.0 ng/mL Final   Comment: The total PSA value from this assay system is  standardized against the WHO standard. The test  result will be approximately 20% lower when compared  to the equimolar-standardized total PSA (Beckman  Coulter). Comparison of serial PSA results should be  interpreted with this fact in mind. . This test was performed using the Siemens  chemiluminescent method. Values obtained from  different assay methods cannot be used interchangeably. PSA levels, regardless of value, should not be interpreted as absolute evidence of the presence or absence of disease.    Cholesterol has worsened since last year however the patient admits that he has been eating a hot dog for breakfast every morning and also tends to eat a lot of junk food out of vending machines at work.  I have recommended decreasing his consumption of saturated fat and junk food.   I have recommended increasing his consumption of fresh fruit and vegetables.  He can even add fish oil 2000 mg daily to help lower his LDL cholesterol and raise his HDL cholesterol.  His tetanus shot and shingles vaccine are up-to-date.  Colonoscopy is up-to-date.  PSA is outstanding.  The remainder of his preventative care is up-to-date.  Follow-up in 1 year or as needed.

## 2018-09-05 ENCOUNTER — Telehealth: Payer: Self-pay | Admitting: Family Medicine

## 2018-09-05 NOTE — Telephone Encounter (Signed)
Patient calling to say that nurse had to check him at work  For dizziness and being disoriented  bp was 140/82 blood sugar was 209 Would like a call back to discuss this  [phone]

## 2018-09-11 NOTE — Telephone Encounter (Signed)
Recommend pt schedule apt to come in to discuss with provider. LMOVM to call and schedule that apt.

## 2018-09-12 NOTE — Telephone Encounter (Signed)
Apt made

## 2018-09-15 ENCOUNTER — Other Ambulatory Visit: Payer: Self-pay

## 2018-09-16 ENCOUNTER — Ambulatory Visit: Payer: 59 | Admitting: Family Medicine

## 2018-09-16 ENCOUNTER — Encounter: Payer: Self-pay | Admitting: Family Medicine

## 2018-09-16 VITALS — BP 134/98 | HR 76 | Temp 98.2°F | Resp 14 | Ht 69.0 in | Wt 215.0 lb

## 2018-09-16 DIAGNOSIS — R42 Dizziness and giddiness: Secondary | ICD-10-CM | POA: Diagnosis not present

## 2018-09-16 NOTE — Progress Notes (Signed)
Subjective:    Patient ID: Brian Guzman, male    DOB: [birth year], 53 y.o.   MRN: [id number]  HPI 1 week ago, the patient woke up to go to work.  He stood up to get out of bed and instantly felt dizzy.  He denies any syncope or presyncope or lightheadedness.  Instead he felt off balance.  He felt like the room was moving.  He tried to stand on 1 foot to put on his shoes and he lost his balance due to the dizziness and fell.  He laid back down and rested a while and the dizziness went away however when he sat back up to go take a shower, he began to feel dizzy again and feel unsteady on his feet.  The symptoms gradually resolved throughout the day and have not reoccurred since.  He denies any presyncope.  He denies any syncope.  He denies any palpitations or chest pain or shortness of breath or dyspnea on exertion.  He denies any seizure-like activity.  He denies any vertigo at the present time.  Dix-Hallpike maneuver is negative bilaterally.  The remainder of his physical exam is completely normal Past Medical History:  Diagnosis Date  . Hypogonadism in male   . Hypothyroidism   . White coat syndrome without diagnosis of hypertension    Past Surgical History:  Procedure Laterality Date  . BREAST SURGERY     removed breast tissue  . COLONOSCOPY N/A 09/12/2016   Procedure: COLONOSCOPY;  Surgeon: Rogene Houston, MD;  Location: [facility];  Service: Endoscopy;  Laterality: N/A;  830  . COSMETIC SURGERY     gyecomastia reduction  . LUMBAR LAMINECTOMY/DECOMPRESSION MICRODISCECTOMY Right 11/17/2014   Procedure: CENTRAL DECOMPRESSIVE LUMBAR LAMINECTOMY AT L4-L5.  MICRODISCECTOMY AT L4-L5 ON RIGHT.  FORAMINOTOMY FOR THE L4 AND L5 NERVE ROOT ON THE RIGHT.;  Surgeon: Latanya Maudlin, MD;  Location: [facility];  Service: Orthopedics;  Laterality: Right;  . TONSILLECTOMY     Current Outpatient Medications on File Prior to Visit  Medication Sig Dispense Refill  . ARMOUR THYROID 60 MG tablet Take 60  mg by mouth daily.  2  . finasteride (PROSCAR) 5 MG tablet     . minoxidil (LONITEN) 2.5 MG tablet Take 1.25 mg by mouth every evening.     No current facility-administered medications on file prior to visit.    No Known Allergies Social History   Socioeconomic History  . Marital status: Single    Spouse name: Not on file  . Number of children: Not on file  . Years of education: Not on file  . Highest education level: Not on file  Occupational History  . Not on file  Social Needs  . Financial resource strain: Not on file  . Food insecurity    Worry: Not on file    Inability: Not on file  . Transportation needs    Medical: Not on file    Non-medical: Not on file  Tobacco Use  . Smoking status: Never Smoker  . Smokeless tobacco: Never Used  Substance and Sexual Activity  . Alcohol use: No  . Drug use: No  . Sexual activity: Not on file  Lifestyle  . Physical activity    Days per week: Not on file    Minutes per session: Not on file  . Stress: Not on file  Relationships  . Social Herbalist on phone: Not on file    Gets together: Not  on file    Attends religious service: Not on file    Active member of club or organization: Not on file    Attends meetings of clubs or organizations: Not on file    Relationship status: Not on file  . Intimate partner violence    Fear of current or ex partner: Not on file    Emotionally abused: Not on file    Physically abused: Not on file    Forced sexual activity: Not on file  Other Topics Concern  . Not on file  Social History Narrative  . Not on file   Family History  Problem Relation Age of Onset  . Hypertension Mother   . Kidney disease Mother   . Heart disease Mother   . Hypertension Father   . Colon cancer Neg Hx    Mother has a history of congestive heart failure and renal failure. Father has a history of hypertension   Review of Systems  All other systems reviewed and are negative.      Objective:    Physical Exam  Constitutional: He is oriented to person, place, and time. He appears well-developed and well-nourished. No distress.  HENT:  Head: Normocephalic and atraumatic.  Right Ear: External ear normal.  Left Ear: External ear normal.  Nose: Nose normal.  Mouth/Throat: Oropharynx is clear and moist. No oropharyngeal exudate.  Eyes: Pupils are equal, round, and reactive to light. Conjunctivae and EOM are normal. Right eye exhibits no discharge. Left eye exhibits no discharge. No scleral icterus.  Neck: Normal range of motion. Neck supple. No JVD present. No tracheal deviation present. No thyromegaly present.  Cardiovascular: Normal rate, regular rhythm, normal heart sounds and intact distal pulses. Exam reveals no gallop and no friction rub.  No murmur heard. Pulmonary/Chest: Effort normal and breath sounds normal. No stridor. No respiratory distress. He has no wheezes. He has no rales. He exhibits no tenderness.  Abdominal: Soft. Bowel sounds are normal. He exhibits no distension and no mass. There is no abdominal tenderness. There is no rebound and no guarding.  Musculoskeletal: Normal range of motion.        General: No tenderness or edema.  Lymphadenopathy:    He has no cervical adenopathy.  Neurological: He is alert and oriented to person, place, and time. He has normal reflexes. No cranial nerve deficit. He exhibits normal muscle tone. Coordination normal.  Skin: Skin is warm. No rash noted. He is not diaphoretic. No erythema. No pallor.  Psychiatric: He has a normal mood and affect. His behavior is normal. Judgment and thought content normal.  Vitals reviewed.         Assessment & Plan:    Vertigo  Symptoms sound consistent with vertigo.  The symptoms have completely resolved and the patient is asymptomatic at the present time.  Therefore we will monitor the patient clinically.  If symptoms return or change in any way he is to contact me immediately however at the  present time his exam today is completely normal and his history is reassuring and does not suggest any serious underlying cardiovascular or neurologic problem.

## 2018-10-20 ENCOUNTER — Encounter: Payer: Self-pay | Admitting: Podiatry

## 2018-10-20 ENCOUNTER — Other Ambulatory Visit: Payer: Self-pay | Admitting: Podiatry

## 2018-10-20 ENCOUNTER — Other Ambulatory Visit: Payer: Self-pay

## 2018-10-20 ENCOUNTER — Ambulatory Visit: Payer: 59 | Admitting: Podiatry

## 2018-10-20 DIAGNOSIS — M79671 Pain in right foot: Secondary | ICD-10-CM

## 2018-10-20 DIAGNOSIS — M779 Enthesopathy, unspecified: Secondary | ICD-10-CM

## 2018-10-20 DIAGNOSIS — M79672 Pain in left foot: Secondary | ICD-10-CM

## 2018-10-20 DIAGNOSIS — Q828 Other specified congenital malformations of skin: Secondary | ICD-10-CM | POA: Diagnosis not present

## 2018-10-20 NOTE — Progress Notes (Signed)
Subjective:   Patient ID: Brian Guzman, male   DOB: 53 y.o.   MRN: [id number]   HPI Patient presents with inflamed fifth metatarsal phalangeal joints of both feet with lesion formations concerned about the lesion stating they can become tender at different times   ROS      Objective:  Physical Exam  Inflammatory capsulitis fifth MPJ of both feet that is painful with lesion formation with lucent type course and pain with palpation     Assessment:  Inflammatory capsulitis bilateral with lesion formation     Plan:  Reviewed condition did careful injections of the fifth MPJ with chronic capsulitis 3 mg dexamethasone 5 mg Xylocaine debrided lesions and educated him on porokeratotic lesions with education given concerning porokeratotic type lesions that the patient has

## 2018-11-13 ENCOUNTER — Other Ambulatory Visit: Payer: Self-pay

## 2018-11-13 DIAGNOSIS — Z20822 Contact with and (suspected) exposure to covid-19: Secondary | ICD-10-CM

## 2018-11-15 LAB — NOVEL CORONAVIRUS, NAA: SARS-CoV-2, NAA: NOT DETECTED

## 2019-02-05 ENCOUNTER — Ambulatory Visit: Payer: 59

## 2019-02-05 ENCOUNTER — Ambulatory Visit (INDEPENDENT_AMBULATORY_CARE_PROVIDER_SITE_OTHER): Payer: 59

## 2019-02-05 ENCOUNTER — Other Ambulatory Visit: Payer: Self-pay

## 2019-02-05 ENCOUNTER — Ambulatory Visit: Payer: 59 | Admitting: Podiatry

## 2019-02-05 ENCOUNTER — Encounter: Payer: Self-pay | Admitting: Podiatry

## 2019-02-05 ENCOUNTER — Other Ambulatory Visit: Payer: Self-pay | Admitting: Podiatry

## 2019-02-05 VITALS — Temp 97.3°F

## 2019-02-05 DIAGNOSIS — M722 Plantar fascial fibromatosis: Secondary | ICD-10-CM | POA: Diagnosis not present

## 2019-02-05 DIAGNOSIS — M25571 Pain in right ankle and joints of right foot: Secondary | ICD-10-CM

## 2019-02-05 DIAGNOSIS — M79671 Pain in right foot: Secondary | ICD-10-CM

## 2019-02-05 MED ORDER — DICLOFENAC SODIUM 75 MG PO TBEC: 75.0000 mg | DELAYED_RELEASE_TABLET | Freq: Two times a day (BID) | ORAL | 2 refills | Status: DC

## 2019-02-05 NOTE — Patient Instructions (Signed)

## 2019-02-06 NOTE — Progress Notes (Signed)
Subjective:   Patient ID: Brian Guzman, male   DOB: 54 y.o.   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  Patient states that he did miss a step and been hurting since then and patient does not smoke likes to be active   ROS      Objective:  Physical Exam  Acute plantar fasciitis right with inflammation fluid of the medial band     Assessment:  H&P and condition reviewed with pain in the center and medial side of the plantar fascial right     Plan:  X-rays reviewed and today I did sterile prep and injected the plantar fascia 3 mg Kenalog 5 mg Xylocaine gave instructions for physical therapy anti-inflammatories and discussed long-term [place] type orthotics to try to provide support.  Reappoint 2 weeks to reevaluate and placed on diclofenac 75 mg twice daily  X-rays indicate there is spur no indication to stress fracture arthritis

## 2019-02-18 ENCOUNTER — Other Ambulatory Visit: Payer: Self-pay | Admitting: Family Medicine

## 2019-02-18 DIAGNOSIS — K648 Other hemorrhoids: Secondary | ICD-10-CM

## 2019-02-19 ENCOUNTER — Ambulatory Visit: Payer: 59 | Admitting: Podiatry

## 2019-02-27 ENCOUNTER — Other Ambulatory Visit: Payer: Self-pay | Admitting: Family Medicine

## 2019-02-27 ENCOUNTER — Encounter: Payer: Self-pay | Admitting: Podiatry

## 2019-02-27 ENCOUNTER — Ambulatory Visit: Payer: 59 | Admitting: Podiatry

## 2019-02-27 ENCOUNTER — Other Ambulatory Visit: Payer: Self-pay

## 2019-02-27 ENCOUNTER — Telehealth: Payer: Self-pay | Admitting: Family Medicine

## 2019-02-27 DIAGNOSIS — M722 Plantar fascial fibromatosis: Secondary | ICD-10-CM | POA: Diagnosis not present

## 2019-02-27 MED ORDER — HYDROCORTISONE 2.5 % EX CREA: TOPICAL_CREAM | Freq: Two times a day (BID) | CUTANEOUS | 0 refills | Status: DC

## 2019-02-27 NOTE — Telephone Encounter (Signed)
Patient has hemorrhoid that is hurting him, he does have an appt on Monday but would like to know if something can be called in  [phone]

## 2019-02-27 NOTE — Telephone Encounter (Signed)
I sent hydrocortisone 2.5 % cream to his pharmacy.

## 2019-03-02 ENCOUNTER — Other Ambulatory Visit: Payer: Self-pay

## 2019-03-02 ENCOUNTER — Encounter: Payer: Self-pay | Admitting: Family Medicine

## 2019-03-02 ENCOUNTER — Ambulatory Visit: Payer: 59 | Admitting: Family Medicine

## 2019-03-02 VITALS — BP 130/92 | HR 90 | Temp 96.4°F | Resp 16 | Ht 69.0 in | Wt 192.0 lb

## 2019-03-02 DIAGNOSIS — K644 Residual hemorrhoidal skin tags: Secondary | ICD-10-CM

## 2019-03-02 MED ORDER — CLOBETASOL PROPIONATE 0.05 % EX CREA: 1.0000 "application " | TOPICAL_CREAM | Freq: Two times a day (BID) | CUTANEOUS | 0 refills | Status: DC

## 2019-03-02 NOTE — Progress Notes (Signed)
Subjective:   Patient ID: Brian Guzman, male   DOB: 54 y.o.   MRN: [id number]   HPI Patient states he is improved and states he only has pain now if he is on his foot for extended periods of time   ROS      Objective:  Physical Exam  Inflammation of the heel that is improved with minimal discomfort now noted upon deep palpation to the tendon     Assessment:  Improved after having chronic plantar fasciitis     Plan:  Reviewed condition discussed different treatments continued oral anti-inflammatories at home and patient is discharged and will be seen back as needed and will continue physical therapy

## 2019-03-02 NOTE — Progress Notes (Signed)
Subjective:    Patient ID: Brian Guzman, male    DOB: [birth year], 54 y.o.   MRN: [id number]  HPI  Patient has an inflamed external hemorrhoid that is extremely tender to palpation.  Has been there for more than a week.  I called him hydrocortisone 2.5% cream last week and he has been using it for the last 3 days with no success.  On examination, the patient has a tender inflamed external hemorrhoid at approximately 7:00 with the patient standing leaning over the exam table.  He denies any blood in his stool although he has been applying topical lidocaine that he bought himself over-the-counter with some success.  There is no evidence of a thrombosis.   Past Medical History:  Diagnosis Date  . Hypogonadism in male   . Hypothyroidism   . White coat syndrome without diagnosis of hypertension    Past Surgical History:  Procedure Laterality Date  . BREAST SURGERY     removed breast tissue  . COLONOSCOPY N/A 09/12/2016   Procedure: COLONOSCOPY;  Surgeon: Rogene Houston, MD;  Location: [facility];  Service: Endoscopy;  Laterality: N/A;  830  . COSMETIC SURGERY     gyecomastia reduction  . LUMBAR LAMINECTOMY/DECOMPRESSION MICRODISCECTOMY Right 11/17/2014   Procedure: CENTRAL DECOMPRESSIVE LUMBAR LAMINECTOMY AT L4-L5.  MICRODISCECTOMY AT L4-L5 ON RIGHT.  FORAMINOTOMY FOR THE L4 AND L5 NERVE ROOT ON THE RIGHT.;  Surgeon: Latanya Maudlin, MD;  Location: [facility];  Service: Orthopedics;  Laterality: Right;  . TONSILLECTOMY     Current Outpatient Medications on File Prior to Visit  Medication Sig Dispense Refill  . ARMOUR THYROID 60 MG tablet Take 60 mg by mouth daily.  2  . diclofenac (VOLTAREN) 75 MG EC tablet Take 1 tablet (75 mg total) by mouth 2 (two) times daily. 50 tablet 2  . finasteride (PROSCAR) 5 MG tablet     . hydrocortisone (ANUSOL-HC) 25 MG suppository PLACE 1 SUPPOSITORY (25 MG TOTAL) RECTALLY 2 (TWO) TIMES DAILY. 12 suppository 2  . minoxidil (LONITEN) 2.5 MG tablet Take  1.25 mg by mouth every evening.     No current facility-administered medications on file prior to visit.   No Known Allergies Social History   Socioeconomic History  . Marital status: Single    Spouse name: Not on file  . Number of children: Not on file  . Years of education: Not on file  . Highest education level: Not on file  Occupational History  . Not on file  Tobacco Use  . Smoking status: Never Smoker  . Smokeless tobacco: Never Used  Substance and Sexual Activity  . Alcohol use: No  . Drug use: No  . Sexual activity: Not on file  Other Topics Concern  . Not on file  Social History Narrative  . Not on file   Social Determinants of Health   Financial Resource Strain:   . Difficulty of Paying Living Expenses: Not on file  Food Insecurity:   . Worried About Charity fundraiser in the Last Year: Not on file  . Ran Out of Food in the Last Year: Not on file  Transportation Needs:   . Lack of Transportation (Medical): Not on file  . Lack of Transportation (Non-Medical): Not on file  Physical Activity:   . Days of Exercise per Week: Not on file  . Minutes of Exercise per Session: Not on file  Stress:   . Feeling of Stress : Not on file  Social  Connections:   . Frequency of Communication with Friends and Family: Not on file  . Frequency of Social Gatherings with Friends and Family: Not on file  . Attends Religious Services: Not on file  . Active Member of Clubs or Organizations: Not on file  . Attends Archivist Meetings: Not on file  . Marital Status: Not on file  Intimate Partner Violence:   . Fear of Current or Ex-Partner: Not on file  . Emotionally Abused: Not on file  . Physically Abused: Not on file  . Sexually Abused: Not on file     Review of Systems  All other systems reviewed and are negative.      Objective:   Physical Exam Vitals reviewed.  Cardiovascular:     Rate and Rhythm: Normal rate and regular rhythm.     Heart sounds:  Normal heart sounds.  Pulmonary:     Effort: Pulmonary effort is normal. No respiratory distress.     Breath sounds: Normal breath sounds. No wheezing or rales.  Abdominal:     General: Abdomen is flat. Bowel sounds are normal.  Genitourinary:    Rectum: Tenderness and external hemorrhoid present.             Assessment & Plan:  External hemorrhoid  We will start clobetasol 0.05% cream applied sparingly 1-2 times a day to the affected area.  I explained to the patient this is a high potency corticosteroid and he needs to apply this sparingly and to discontinue it if it causes any burning or irritation.  He can also use lidocaine cream 3-4 times a day as needed for discomfort.  Reassess if no better in 1 week or sooner if worsening.  Recommended a stool softener as well.

## 2019-03-03 ENCOUNTER — Ambulatory Visit: Payer: 59 | Admitting: Family Medicine

## 2019-04-03 ENCOUNTER — Other Ambulatory Visit: Payer: Self-pay | Admitting: Family Medicine

## 2019-04-18 ENCOUNTER — Other Ambulatory Visit: Payer: Self-pay | Admitting: Family Medicine

## 2019-04-20 ENCOUNTER — Telehealth: Payer: Self-pay | Admitting: Family Medicine

## 2019-04-20 NOTE — Telephone Encounter (Signed)
FMLA paperwork filled out and faxed to [employer]/ Christie Nottingham at [phone]

## 2019-05-08 ENCOUNTER — Other Ambulatory Visit: Payer: Self-pay | Admitting: Podiatry

## 2019-05-19 ENCOUNTER — Ambulatory Visit: Payer: 59 | Admitting: Family Medicine

## 2019-07-15 ENCOUNTER — Other Ambulatory Visit: Payer: Self-pay

## 2019-07-15 ENCOUNTER — Other Ambulatory Visit: Payer: 59

## 2019-07-15 DIAGNOSIS — Z13220 Encounter for screening for lipoid disorders: Secondary | ICD-10-CM

## 2019-07-15 DIAGNOSIS — Z125 Encounter for screening for malignant neoplasm of prostate: Secondary | ICD-10-CM

## 2019-07-15 DIAGNOSIS — Z0000 Encounter for general adult medical examination without abnormal findings: Secondary | ICD-10-CM

## 2019-07-15 DIAGNOSIS — E291 Testicular hypofunction: Secondary | ICD-10-CM

## 2019-07-15 LAB — TESTOSTERONE: Testosterone: 1354 ng/dL — ABNORMAL HIGH (ref 250–827)

## 2019-07-16 LAB — CBC WITH DIFFERENTIAL/PLATELET
Absolute Monocytes: 789 cells/uL (ref 200–950)
Basophils Absolute: 102 cells/uL (ref 0–200)
Basophils Relative: 1.5 %
Eosinophils Absolute: 238 cells/uL (ref 15–500)
Eosinophils Relative: 3.5 %
HCT: 48.2 % (ref 38.5–50.0)
Hemoglobin: 15.9 g/dL (ref 13.2–17.1)
Lymphs Abs: 1931 cells/uL (ref 850–3900)
MCH: 31.1 pg (ref 27.0–33.0)
MCHC: 33 g/dL (ref 32.0–36.0)
MCV: 94.1 fL (ref 80.0–100.0)
MPV: 12.2 fL (ref 7.5–12.5)
Monocytes Relative: 11.6 %
Neutro Abs: 3740 cells/uL (ref 1500–7800)
Neutrophils Relative %: 55 %
Platelets: 221 10*3/uL (ref 140–400)
RBC: 5.12 10*6/uL (ref 4.20–5.80)
RDW: 12.4 % (ref 11.0–15.0)
Total Lymphocyte: 28.4 %
WBC: 6.8 10*3/uL (ref 3.8–10.8)

## 2019-07-16 LAB — COMPLETE METABOLIC PANEL WITH GFR
AG Ratio: 1.4 (calc) (ref 1.0–2.5)
ALT: 15 U/L (ref 9–46)
AST: 11 U/L (ref 10–35)
Albumin: 4.2 g/dL (ref 3.6–5.1)
Alkaline phosphatase (APISO): 57 U/L (ref 35–144)
BUN: 11 mg/dL (ref 7–25)
CO2: 29 mmol/L (ref 20–32)
Calcium: 8.9 mg/dL (ref 8.6–10.3)
Chloride: 102 mmol/L (ref 98–110)
Creat: 0.89 mg/dL (ref 0.70–1.33)
GFR, Est African American: 112 mL/min/{1.73_m2} (ref >=60)
GFR, Est Non African American: 97 mL/min/{1.73_m2} (ref >=60)
Globulin: 2.9 g/dL (calc) (ref 1.9–3.7)
Glucose, Bld: 87 mg/dL (ref 65–99)
Potassium: 4.5 mmol/L (ref 3.5–5.3)
Sodium: 139 mmol/L (ref 135–146)
Total Bilirubin: 0.7 mg/dL (ref 0.2–1.2)
Total Protein: 7.1 g/dL (ref 6.1–8.1)

## 2019-07-16 LAB — LIPID PANEL
Cholesterol: 142 mg/dL (ref <200)
HDL: 34 mg/dL — ABNORMAL LOW (ref >=40)
LDL Cholesterol (Calc): 90 mg/dL (calc)
Non-HDL Cholesterol (Calc): 108 mg/dL (calc) (ref <130)
Total CHOL/HDL Ratio: 4.2 (calc) (ref <5.0)
Triglycerides: 88 mg/dL (ref <150)

## 2019-07-16 LAB — PSA: PSA: 0.2 ng/mL (ref <=4.0)

## 2019-07-20 ENCOUNTER — Other Ambulatory Visit: Payer: Self-pay

## 2019-07-20 ENCOUNTER — Ambulatory Visit: Payer: 59 | Admitting: Family Medicine

## 2019-07-20 VITALS — BP 120/88 | HR 89 | Temp 98.3°F | Ht 69.0 in | Wt 198.0 lb

## 2019-07-20 DIAGNOSIS — Z0001 Encounter for general adult medical examination with abnormal findings: Secondary | ICD-10-CM

## 2019-07-20 DIAGNOSIS — E291 Testicular hypofunction: Secondary | ICD-10-CM | POA: Diagnosis not present

## 2019-07-20 DIAGNOSIS — Z0000 Encounter for general adult medical examination without abnormal findings: Secondary | ICD-10-CM

## 2019-07-20 NOTE — Progress Notes (Signed)
Subjective:    Patient ID: Brian Guzman, male    DOB: Jul 20, 1965, 54 y.o.   MRN: [id number]  HPI Patient is here today for complete physical exam.  Past medical history significant for hypothyroidism as well as hypogonadism which is being treated by another physician. Colonoscopy was completely normal in 2018.  Patient sees another physician who monitors his thyroid and also checks his testosterone.  This physician is supplementing his testosterone with testosterone pellets.  Patient denies any medical concerns.   Lab on 07/15/2019  Component Date Value Ref Range Status  . Cholesterol 07/15/2019 142  <200 mg/dL Final  . HDL 07/15/2019 34* > OR = 40 mg/dL Final  . Triglycerides 07/15/2019 88  <150 mg/dL Final  . LDL Cholesterol (Calc) 07/15/2019 90  mg/dL (calc) Final   Comment: Reference range: <100 . Desirable range <100 mg/dL for primary prevention;   <70 mg/dL for patients with CHD or diabetic patients  with > or = 2 CHD risk factors. Marland Kitchen LDL-C is now calculated using the Martin-Hopkins  calculation, which is a validated novel method providing  better accuracy than the Friedewald equation in the  estimation of LDL-C.  Cresenciano Genre et al. Annamaria Helling. 1829;937(16): 2061-2068  (http://education.[facility].com/faq/FAQ164)   . Total CHOL/HDL Ratio 07/15/2019 4.2  <5.0 (calc) Final  . Non-HDL Cholesterol (Calc) 07/15/2019 108  <130 mg/dL (calc) Final   Comment: For patients with diabetes plus 1 major ASCVD risk  factor, treating to a non-HDL-C goal of <100 mg/dL  (LDL-C of <70 mg/dL) is considered a therapeutic  option.   . WBC 07/15/2019 6.8  3.8 - 10.8 Thousand/uL Final  . RBC 07/15/2019 5.12  4.20 - 5.80 Million/uL Final  . Hemoglobin 07/15/2019 15.9  13.2 - 17.1 g/dL Final  . HCT 07/15/2019 48.2  38 - 50 % Final  . MCV 07/15/2019 94.1  80.0 - 100.0 fL Final  . MCH 07/15/2019 31.1  27.0 - 33.0 pg Final  . MCHC 07/15/2019 33.0  32.0 - 36.0 g/dL Final  . RDW 07/15/2019 12.4   11.0 - 15.0 % Final  . Platelets 07/15/2019 221  140 - 400 Thousand/uL Final  . MPV 07/15/2019 12.2  7.5 - 12.5 fL Final  . Neutro Abs 07/15/2019 3,740  1,500 - 7,800 cells/uL Final  . Lymphs Abs 07/15/2019 1,931  850 - 3,900 cells/uL Final  . Absolute Monocytes 07/15/2019 789  200 - 950 cells/uL Final  . Eosinophils Absolute 07/15/2019 238  15 - 500 cells/uL Final  . Basophils Absolute 07/15/2019 102  0 - 200 cells/uL Final  . Neutrophils Relative % 07/15/2019 55  % Final  . Total Lymphocyte 07/15/2019 28.4  % Final  . Monocytes Relative 07/15/2019 11.6  % Final  . Eosinophils Relative 07/15/2019 3.5  % Final  . Basophils Relative 07/15/2019 1.5  % Final  . PSA 07/15/2019 0.2  < OR = 4.0 ng/mL Final   Comment: The total PSA value from this assay system is  standardized against the WHO standard. The test  result will be approximately 20% lower when compared  to the equimolar-standardized total PSA (Beckman  Coulter). Comparison of serial PSA results should be  interpreted with this fact in mind. . This test was performed using the Siemens  chemiluminescent method. Values obtained from  different assay methods cannot be used interchangeably. PSA levels, regardless of value, should not be interpreted as absolute evidence of the presence or absence of disease.   . Glucose, Bld 07/15/2019 87  65 - 99 mg/dL Final   Comment: .            Fasting reference interval .   . BUN 07/15/2019 11  7 - 25 mg/dL Final  . Creat 07/15/2019 0.89  0.70 - 1.33 mg/dL Final   Comment: For patients >25 years of age, the reference limit for Creatinine is approximately 13% higher for people identified as African-American. .   . GFR, Est Non African American 07/15/2019 97  > OR = 60 mL/min/1.49m2 Final  . GFR, Est African American 07/15/2019 112  > OR = 60 mL/min/1.61m2 Final  . BUN/Creatinine Ratio 38/18/2993 NOT APPLICABLE  6 - 22 (calc) Final  . Sodium 07/15/2019 139  135 - 146 mmol/L Final  .  Potassium 07/15/2019 4.5  3.5 - 5.3 mmol/L Final  . Chloride 07/15/2019 102  98 - 110 mmol/L Final  . CO2 07/15/2019 29  20 - 32 mmol/L Final  . Calcium 07/15/2019 8.9  8.6 - 10.3 mg/dL Final  . Total Protein 07/15/2019 7.1  6.1 - 8.1 g/dL Final  . Albumin 07/15/2019 4.2  3.6 - 5.1 g/dL Final  . Globulin 07/15/2019 2.9  1.9 - 3.7 g/dL (calc) Final  . AG Ratio 07/15/2019 1.4  1.0 - 2.5 (calc) Final  . Total Bilirubin 07/15/2019 0.7  0.2 - 1.2 mg/dL Final  . Alkaline phosphatase (APISO) 07/15/2019 57  35 - 144 U/L Final  . AST 07/15/2019 11  10 - 35 U/L Final  . ALT 07/15/2019 15  9 - 46 U/L Final  . Testosterone 07/15/2019 1,354* 250 - 827 ng/dL Final   Immunization History  Administered Date(s) Administered  . Tdap 04/22/2017  . Zoster Recombinat (Shingrix) 04/16/2016, 08/28/2016   Past Medical History:  Diagnosis Date  . Hypogonadism in male   . Hypothyroidism   . White coat syndrome without diagnosis of hypertension    Past Surgical History:  Procedure Laterality Date  . BREAST SURGERY     removed breast tissue  . COLONOSCOPY N/A 09/12/2016   Procedure: COLONOSCOPY;  Surgeon: Rogene Houston, MD;  Location: [facility];  Service: Endoscopy;  Laterality: N/A;  830  . COSMETIC SURGERY     gyecomastia reduction  . LUMBAR LAMINECTOMY/DECOMPRESSION MICRODISCECTOMY Right 11/17/2014   Procedure: CENTRAL DECOMPRESSIVE LUMBAR LAMINECTOMY AT L4-L5.  MICRODISCECTOMY AT L4-L5 ON RIGHT.  FORAMINOTOMY FOR THE L4 AND L5 NERVE ROOT ON THE RIGHT.;  Surgeon: Latanya Maudlin, MD;  Location: [facility];  Service: Orthopedics;  Laterality: Right;  . TONSILLECTOMY     Current Outpatient Medications on File Prior to Visit  Medication Sig Dispense Refill  . ARMOUR THYROID 60 MG tablet Take 60 mg by mouth daily.  2  . clobetasol cream (TEMOVATE) 0.05 % APPLY TO AFFECTED AREA TWICE A DAY 30 g 0  . diclofenac (VOLTAREN) 75 MG EC tablet TAKE 1 TABLET BY MOUTH TWICE A DAY 50 tablet 2  . finasteride  (PROSCAR) 5 MG tablet     . hydrocortisone (ANUSOL-HC) 25 MG suppository PLACE 1 SUPPOSITORY (25 MG TOTAL) RECTALLY 2 (TWO) TIMES DAILY. 12 suppository 2  . hydrocortisone 2.5 % cream APPLY TO AFFECTED AREA TWICE A DAY 28.35 g 1  . minoxidil (LONITEN) 2.5 MG tablet Take 1.25 mg by mouth every evening.     No current facility-administered medications on file prior to visit.   No Known Allergies Social History   Socioeconomic History  . Marital status: Single    Spouse name: Not on file  .  Number of children: Not on file  . Years of education: Not on file  . Highest education level: Not on file  Occupational History  . Not on file  Tobacco Use  . Smoking status: Never Smoker  . Smokeless tobacco: Never Used  Vaping Use  . Vaping Use: Never used  Substance and Sexual Activity  . Alcohol use: No  . Drug use: No  . Sexual activity: Not on file  Other Topics Concern  . Not on file  Social History Narrative  . Not on file   Social Determinants of Health   Financial Resource Strain:   . Difficulty of Paying Living Expenses:   Food Insecurity:   . Worried About Charity fundraiser in the Last Year:   . Arboriculturist in the Last Year:   Transportation Needs:   . Film/video editor (Medical):   Marland Kitchen Lack of Transportation (Non-Medical):   Physical Activity:   . Days of Exercise per Week:   . Minutes of Exercise per Session:   Stress:   . Feeling of Stress :   Social Connections:   . Frequency of Communication with Friends and Family:   . Frequency of Social Gatherings with Friends and Family:   . Attends Religious Services:   . Active Member of Clubs or Organizations:   . Attends Archivist Meetings:   Marland Kitchen Marital Status:   Intimate Partner Violence:   . Fear of Current or Ex-Partner:   . Emotionally Abused:   Marland Kitchen Physically Abused:   . Sexually Abused:    Family History  Problem Relation Age of Onset  . Hypertension Mother   . Kidney disease Mother   .  Heart disease Mother   . Hypertension Father   . Colon cancer Neg Hx    Mother had a history of congestive heart failure and renal failure. Father has a history of hypertension   Review of Systems  All other systems reviewed and are negative.      Objective:   Physical Exam Vitals reviewed.  Constitutional:      General: He is not in acute distress.    Appearance: He is well-developed. He is not diaphoretic.  HENT:     Head: Normocephalic and atraumatic.     Right Ear: External ear normal.     Left Ear: External ear normal.     Nose: Nose normal.     Mouth/Throat:     Pharynx: No oropharyngeal exudate.  Eyes:     General: No scleral icterus.       Right eye: No discharge.        Left eye: No discharge.     Conjunctiva/sclera: Conjunctivae normal.     Pupils: Pupils are equal, round, and reactive to light.  Neck:     Thyroid: No thyromegaly.     Vascular: No JVD.     Trachea: No tracheal deviation.  Cardiovascular:     Rate and Rhythm: Normal rate and regular rhythm.     Heart sounds: Normal heart sounds. No murmur heard.  No friction rub. No gallop.   Pulmonary:     Effort: Pulmonary effort is normal. No respiratory distress.     Breath sounds: Normal breath sounds. No stridor. No wheezing or rales.  Chest:     Chest wall: No tenderness.  Abdominal:     General: Bowel sounds are normal. There is no distension.     Palpations: Abdomen is soft. There is  no mass.     Tenderness: There is no abdominal tenderness. There is no guarding or rebound.  Musculoskeletal:        General: No tenderness. Normal range of motion.     Cervical back: Normal range of motion and neck supple.  Lymphadenopathy:     Cervical: No cervical adenopathy.  Skin:    General: Skin is warm.     Coloration: Skin is not pale.     Findings: No erythema or rash.  Neurological:     Mental Status: He is alert and oriented to person, place, and time.     Cranial Nerves: No cranial nerve deficit.      Motor: No abnormal muscle tone.     Coordination: Coordination normal.     Deep Tendon Reflexes: Reflexes are normal and symmetric.  Psychiatric:        Behavior: Behavior normal.        Thought Content: Thought content normal.        Judgment: Judgment normal.           Assessment & Plan:    General medical exam  Hypogonadism in male  Physical exam today is completely normal.  Cholesterol is outstanding except for a mildly suppressed HDL.  I recommended fish oil 2000 mg a day along with aerobic exercise to address this.  Blood pressure is excellent.  Colonoscopy and prostate cancer screening are up-to-date.  I did recommend an annual flu shot.  I also recommended the COVID-19 vaccination.  I spent approximately 10 minutes today with the patient explaining the rationale behind COVID-19 vaccination and its safety.  Patient will consider this.  The remainder of his preventative care is up-to-date.  Congratulated the patient on his excellent lab work

## 2019-12-15 ENCOUNTER — Other Ambulatory Visit: Payer: Self-pay | Admitting: Family Medicine

## 2019-12-15 MED ORDER — CLOBETASOL PROPIONATE 0.05 % EX CREA: TOPICAL_CREAM | Freq: Two times a day (BID) | CUTANEOUS | 0 refills | Status: DC

## 2020-01-06 ENCOUNTER — Telehealth: Payer: Self-pay | Admitting: *Deleted

## 2020-01-06 NOTE — Telephone Encounter (Signed)
Received call from [facility], nurse with [employer].  Reports that patient is seeing her today for possible vaccine reaction. States that he received COVID vaccine #2 on 01/05/2019.  Reports that patient is voicing C/O tachycardia. States that he feels wired today, like he has increased energy. Reports that apical HR noted at 99, but pulse ox is picking up between 109- 129. SpO2 97%. BP WNL.   Patient denies chest pain, itching, SOB.   Advised to go to [facility] for evaluation. Patient declined.   Appointment scheduled with PCP for evaluation.

## 2020-01-07 ENCOUNTER — Other Ambulatory Visit: Payer: Self-pay

## 2020-01-07 ENCOUNTER — Ambulatory Visit: Payer: 59 | Admitting: Family Medicine

## 2020-01-07 ENCOUNTER — Encounter: Payer: Self-pay | Admitting: Family Medicine

## 2020-01-07 VITALS — HR 94 | Temp 98.0°F | Resp 14 | Ht 69.0 in | Wt 194.0 lb

## 2020-01-07 DIAGNOSIS — R002 Palpitations: Secondary | ICD-10-CM | POA: Diagnosis not present

## 2020-01-07 NOTE — Progress Notes (Signed)
Subjective:    Patient ID: Brian Guzman, male    DOB: 01-22-1965, 55 y.o.   MRN: [id number]  HPI 3 days ago, the patient got his second Covid vaccine.  Yesterday he felt his heart racing and beating irregular.  A nurse at work said he had an irregular heartbeat and recommended to go to the emergency room.  He declined to go to the emergency room but he decided to come here instead today.  On physical exam, he does have an irregularly irregular heartbeat.  In 2019 I did an EKG that showed frequent ectopic beats consistent with PVCs.  He states that yesterday when his heart was racing he may have been running a fever.  Last night he had a fever to 103.  Today he feels much better. Past Medical History:  Diagnosis Date  . Hypogonadism in male   . Hypothyroidism   . White coat syndrome without diagnosis of hypertension    Past Surgical History:  Procedure Laterality Date  . BREAST SURGERY     removed breast tissue  . COLONOSCOPY N/A 09/12/2016   Procedure: COLONOSCOPY;  Surgeon: Rogene Houston, MD;  [facility]: [facility];  Service: Endoscopy;  Laterality: N/A;  830  . COSMETIC SURGERY     gyecomastia reduction  . LUMBAR LAMINECTOMY/DECOMPRESSION MICRODISCECTOMY Right 11/17/2014   Procedure: CENTRAL DECOMPRESSIVE LUMBAR LAMINECTOMY AT L4-L5.  MICRODISCECTOMY AT L4-L5 ON RIGHT.  FORAMINOTOMY FOR THE L4 AND L5 NERVE ROOT ON THE RIGHT.;  Surgeon: Latanya Maudlin, MD;  Location: [facility];  Service: Orthopedics;  Laterality: Right;  . TONSILLECTOMY     Current Outpatient Medications on File Prior to Visit  Medication Sig Dispense Refill  . ARMOUR THYROID 60 MG tablet Take 60 mg by mouth daily.  2  . clobetasol cream (TEMOVATE) 0.05 % Apply topically 2 (two) times daily. 30 g 0  . diclofenac (VOLTAREN) 75 MG EC tablet TAKE 1 TABLET BY MOUTH TWICE A DAY 50 tablet 2  . finasteride (PROSCAR) 5 MG tablet     . hydrocortisone (ANUSOL-HC) 25 MG suppository PLACE 1 SUPPOSITORY (25 MG TOTAL)  RECTALLY 2 (TWO) TIMES DAILY. 12 suppository 2  . hydrocortisone 2.5 % cream APPLY TO AFFECTED AREA TWICE A DAY 28.35 g 1  . minoxidil (LONITEN) 2.5 MG tablet Take 1.25 mg by mouth every evening.     No current facility-administered medications on file prior to visit.   No Known Allergies Social History   Socioeconomic History  . Marital status: Single    Spouse name: Not on file  . Number of children: Not on file  . Years of education: Not on file  . Highest education level: Not on file  Occupational History  . Not on file  Tobacco Use  . Smoking status: Never Smoker  . Smokeless tobacco: Never Used  Vaping Use  . Vaping Use: Never used  Substance and Sexual Activity  . Alcohol use: No  . Drug use: No  . Sexual activity: Not on file  Other Topics Concern  . Not on file  Social History Narrative  . Not on file   Social Determinants of Health   Financial Resource Strain: Not on file  Food Insecurity: Not on file  Transportation Needs: Not on file  Physical Activity: Not on file  Stress: Not on file  Social Connections: Not on file  Intimate Partner Violence: Not on file   Family History  Problem Relation Age of Onset  . Hypertension Mother   .  Kidney disease Mother   . Heart disease Mother   . Hypertension Father   . Colon cancer Neg Hx      Review of Systems  All other systems reviewed and are negative.      Objective:   Physical Exam Vitals reviewed.  Constitutional:      General: He is not in acute distress.    Appearance: He is well-developed. He is not diaphoretic.  HENT:     Head: Normocephalic and atraumatic.     Right Ear: External ear normal.     Left Ear: External ear normal.     Nose: Nose normal.     Mouth/Throat:     Pharynx: No oropharyngeal exudate.  Eyes:     General: No scleral icterus.       Right eye: No discharge.        Left eye: No discharge.     Conjunctiva/sclera: Conjunctivae normal.     Pupils: Pupils are equal, round,  and reactive to light.  Neck:     Thyroid: No thyromegaly.     Vascular: No JVD.     Trachea: No tracheal deviation.  Cardiovascular:     Rate and Rhythm: Normal rate. Rhythm irregular.     Heart sounds: Normal heart sounds. No murmur heard. No friction rub. No gallop.   Pulmonary:     Effort: Pulmonary effort is normal. No respiratory distress.     Breath sounds: Normal breath sounds. No stridor. No wheezing or rales.  Chest:     Chest wall: No tenderness.  Abdominal:     General: Bowel sounds are normal. There is no distension.     Palpations: Abdomen is soft. There is no mass.     Tenderness: There is no abdominal tenderness. There is no guarding or rebound.  Musculoskeletal:        General: No tenderness. Normal range of motion.     Cervical back: Normal range of motion and neck supple.  Lymphadenopathy:     Cervical: No cervical adenopathy.  Skin:    General: Skin is warm.     Coloration: Skin is not pale.     Findings: No erythema or rash.  Neurological:     Mental Status: He is alert and oriented to person, place, and time.     Cranial Nerves: No cranial nerve deficit.     Motor: No abnormal muscle tone.     Coordination: Coordination normal.     Deep Tendon Reflexes: Reflexes are normal and symmetric.  Psychiatric:        Behavior: Behavior normal.        Thought Content: Thought content normal.        Judgment: Judgment normal.     EKG today shows normal sinus rhythm but frequent PACs.      Assessment & Plan:  Palpitations - Plan: EKG 12-Lead  Patient states that yesterday his heart was beating so fast the nurse at his office cannot even count the heartbeats furthermore it was irregular.  EKG today simply shows sinus rhythm with frequent PACs.  Therefore I will consult cardiology for a Holter monitor.  I want to rule out SVT or atrial fibrillation with RVR however I suspect that the patient was having a fever due to his Covid vaccine and the fever was causing  sinus tachycardia that only highlighted his PACs.  I explained this to the patient but he certainly would appreciate getting the cardiac monitor to rule out other arrhythmias.

## 2020-02-09 NOTE — Progress Notes (Signed)
CARDIOLOGY CONSULT NOTE       Patient ID: Brian Guzman MRN: [id number] DOB/AGE: [birth year] 56 y.o.  Admit date: (Not on file) Referring Physician: Pickard Primary Physician: Susy Frizzle, MD Primary Cardiologist: New Reason for Consultation: Palpitations  Active Problems:   * No active hospital problems. *   HPI:  55 y.o. referred by Dr Dennard Schaumann for palpitations No history of cardiac issues. History of borderline BP. 3 days after getting COVID vaccine 01/08/20 noted rapid palpitations with some irregularity. This was in the setting of fever post vaccine ECG done in office was normal other than frequent PAC;s No associated sycncope, chest pain, dyspnea He is on armour thyroid for hypothyroidism Last TSH/T4 in Epic normal June 2020 He has some prostatism and is on proscar   He seems a bit high strung to interview. Some white coat component Use to take losartan But BP got low  Has long time girlfriend Likes to do yard work Works as Librarian, academic at [employer] reviewed and negative except as noted above  Past Medical History:  Diagnosis Date  . Hypogonadism in male   . Hypothyroidism   . White coat syndrome without diagnosis of hypertension     Family History  Problem Relation Age of Onset  . Hypertension Mother   . Kidney disease Mother   . Heart disease Mother   . Hypertension Father   . Colon cancer Neg Hx     Social History   Socioeconomic History  . Marital status: Single    Spouse name: Not on file  . Number of children: Not on file  . Years of education: Not on file  . Highest education level: Not on file  Occupational History  . Not on file  Tobacco Use  . Smoking status: Never Smoker  . Smokeless tobacco: Never Used  Vaping Use  . Vaping Use: Never used  Substance and Sexual Activity  . Alcohol use: No  . Drug use: No  . Sexual activity: Not on file  Other Topics Concern  . Not on file  Social History Narrative  .  Not on file   Social Determinants of Health   Financial Resource Strain: Not on file  Food Insecurity: Not on file  Transportation Needs: Not on file  Physical Activity: Not on file  Stress: Not on file  Social Connections: Not on file  Intimate Partner Violence: Not on file    Past Surgical History:  Procedure Laterality Date  . BREAST SURGERY     removed breast tissue  . COLONOSCOPY N/A 09/12/2016   Procedure: COLONOSCOPY;  Surgeon: Rogene Houston, MD;  Location: [facility];  Service: Endoscopy;  Laterality: N/A;  830  . COSMETIC SURGERY     gyecomastia reduction  . LUMBAR LAMINECTOMY/DECOMPRESSION MICRODISCECTOMY Right 11/17/2014   Procedure: CENTRAL DECOMPRESSIVE LUMBAR LAMINECTOMY AT L4-L5.  MICRODISCECTOMY AT L4-L5 ON RIGHT.  FORAMINOTOMY FOR THE L4 AND L5 NERVE ROOT ON THE RIGHT.;  Surgeon: Latanya Maudlin, MD;  Location: [facility];  Service: Orthopedics;  Laterality: Right;  . TONSILLECTOMY        Current Outpatient Medications:  .  ARMOUR THYROID 60 MG tablet, Take 60 mg by mouth daily., Disp: , Rfl: 2 .  clobetasol cream (TEMOVATE) 0.05 %, Apply topically 2 (two) times daily., Disp: 30 g, Rfl: 0 .  finasteride (PROSCAR) 5 MG tablet, Take 5 mg by mouth daily., Disp: , Rfl:  .  minoxidil (LONITEN) 2.5  MG tablet, Take 1.25 mg by mouth every evening., Disp: , Rfl:     Physical Exam: Blood pressure (!) 146/98, pulse 91, height 5\' 9"  (1.753 m), weight 89.8 kg, SpO2 98 %.   Affect appropriate Healthy:  appears stated age 55: normal Neck supple with no adenopathy JVP normal no bruits no thyromegaly Lungs clear with no wheezing and good diaphragmatic motion Heart:  S1/S2 no murmur, no rub, gallop or click PMI normal Abdomen: benighn, BS positve, no tenderness, no AAA no bruit.  No HSM or HJR Distal pulses intact with no bruits No edema Neuro non-focal Skin warm and dry No muscular weakness   Labs:   Lab Results  Component Value Date   WBC 6.8 07/15/2019    HGB 15.9 07/15/2019   HCT 48.2 07/15/2019   MCV 94.1 07/15/2019   PLT 221 07/15/2019   No results for input(s): NA, K, CL, CO2, BUN, CREATININE, CALCIUM, PROT, BILITOT, ALKPHOS, ALT, AST, GLUCOSE in the last 168 hours.  Invalid input(s): LABALBU No results found for: CKTOTAL, CKMB, CKMBINDEX, TROPONINI  Lab Results  Component Value Date   CHOL 142 07/15/2019   CHOL 167 07/15/2018   CHOL 142 04/17/2017   Lab Results  Component Value Date   HDL 34 (L) 07/15/2019   HDL 35 (L) 07/15/2018   HDL 41 04/17/2017   Lab Results  Component Value Date   LDLCALC 90 07/15/2019   LDLCALC 115 (H) 07/15/2018   LDLCALC 83 04/17/2017   Lab Results  Component Value Date   TRIG 88 07/15/2019   TRIG 78 07/15/2018   TRIG 89 04/17/2017   Lab Results  Component Value Date   CHOLHDL 4.2 07/15/2019   CHOLHDL 4.8 07/15/2018   CHOLHDL 3.5 04/17/2017   No results found for: LDLDIRECT    Radiology: No results found.  EKG: NSR PACls otherwise normal 01/07/20    ASSESSMENT AND PLAN:   1. Palpitations:  PAC;s documented on ECG post Pfizer vaccine in association with fever symptoms Not frequent enough for monitor Check echo PRN inderal   2. Prostatism:  Continue proscar and bi yearly PSA  3. Thyroid: on armour replacement in setting of atrial irritability check TSH/T4 primary   4. CAD:  Discussed risk stratification with coronary calcium score   Echo for arrhythmia r/o structural heart dx TSH/T4 Calcium score   F/U PRN if testing normal  Signed: Jenkins Rouge 02/17/2020, 10:27 AM

## 2020-02-17 ENCOUNTER — Ambulatory Visit: Payer: 59 | Admitting: Cardiovascular Disease

## 2020-02-17 ENCOUNTER — Other Ambulatory Visit: Payer: Self-pay

## 2020-02-17 ENCOUNTER — Encounter: Payer: Self-pay | Admitting: Cardiovascular Disease

## 2020-02-17 VITALS — BP 146/98 | HR 91 | Ht 69.0 in | Wt 198.0 lb

## 2020-02-17 DIAGNOSIS — R030 Elevated blood-pressure reading, without diagnosis of hypertension: Secondary | ICD-10-CM

## 2020-02-17 DIAGNOSIS — R002 Palpitations: Secondary | ICD-10-CM | POA: Diagnosis not present

## 2020-02-17 DIAGNOSIS — I2510 Atherosclerotic heart disease of native coronary artery without angina pectoris: Secondary | ICD-10-CM

## 2020-02-17 MED ORDER — PROPRANOLOL HCL 10 MG PO TABS: 10.0000 mg | ORAL_TABLET | ORAL | 1 refills | Status: DC | PRN

## 2020-02-17 NOTE — Addendum Note (Signed)
Addended by: Levonne Hubert on: 02/17/2020 10:50 AM   Modules accepted: Orders

## 2020-02-17 NOTE — Patient Instructions (Signed)
Medication Instructions:  Your physician has recommended you make the following change in your medication:   Start Inderal 10 mg as needed for Palpitations   *If you need a refill on your cardiac medications before your next appointment, please call your pharmacy*   Lab Work: NONE   If you have labs (blood work) drawn today and your tests are completely normal, you will receive your results only by: Marland Kitchen MyChart Message (if you have MyChart) OR . A paper copy in the mail If you have any lab test that is abnormal or we need to change your treatment, we will call you to review the results.   Testing/Procedures: Your physician has requested that you have an echocardiogram. Echocardiography is a painless test that uses sound waves to create images of your heart. It provides your doctor with information about the size and shape of your heart and how well your heart's chambers and valves are working. This procedure takes approximately one hour. There are no restrictions for this procedure.   Follow-Up: At [facility], you and your health needs are our priority.  As part of our continuing mission to provide you with exceptional heart care, we have created designated Provider Care Teams.  These Care Teams include your primary Cardiologist (physician) and Advanced Practice Providers (APPs -  Physician Assistants and Nurse Practitioners) who all work together to provide you with the care you need, when you need it.  We recommend signing up for the patient portal called "MyChart".  Sign up information is provided on this After Visit Summary.  MyChart is used to connect with patients for Virtual Visits (Telemedicine).  Patients are able to view lab/test results, encounter notes, upcoming appointments, etc.  Non-urgent messages can be sent to your provider as well.   To learn more about what you can do with MyChart, go to [email or web address].    Your next appointment:    As Needed   The format  for your next appointment:   In Person  Provider:   Jenkins Rouge, MD   Other Instructions Thank you for choosing [facility]!

## 2020-02-26 ENCOUNTER — Other Ambulatory Visit: Payer: Self-pay

## 2020-02-26 ENCOUNTER — Ambulatory Visit (HOSPITAL_COMMUNITY)
Admission: RE | Admit: 2020-02-26 | Discharge: 2020-02-26 | Disposition: A | Discharge status: Home / Self Care | Payer: 59 | Source: Ambulatory Visit | Attending: Cardiovascular Disease | Admitting: Cardiovascular Disease

## 2020-02-26 ENCOUNTER — Telehealth: Payer: Self-pay

## 2020-02-26 DIAGNOSIS — R002 Palpitations: Secondary | ICD-10-CM

## 2020-02-26 LAB — ECHOCARDIOGRAM COMPLETE
Area-P 1/2: 4.12 cm2
S' Lateral: 2.6 cm

## 2020-02-26 NOTE — Telephone Encounter (Signed)
Contacted patient and he voiced understanding of result notes. Will continue with CT Cardiac Score and had no questions or concerns at this time.

## 2020-02-26 NOTE — Telephone Encounter (Signed)
-----   Message from Josue Hector, MD sent at 02/26/2020 12:55 PM EST ----- Normal echo with good EF and no significant valvular heart disease

## 2020-02-26 NOTE — Progress Notes (Signed)
*  PRELIMINARY RESULTS* Echocardiogram 2D Echocardiogram has been performed.  Samuel Germany 02/26/2020, 10:30 AM

## 2020-03-02 ENCOUNTER — Telehealth: Payer: Self-pay | Admitting: Cardiovascular Disease

## 2020-03-02 ENCOUNTER — Ambulatory Visit (HOSPITAL_COMMUNITY)
Admission: RE | Admit: 2020-03-02 | Discharge: 2020-03-02 | Disposition: A | Discharge status: Home / Self Care | Payer: 59 | Source: Ambulatory Visit | Attending: Cardiovascular Disease | Admitting: Cardiovascular Disease

## 2020-03-02 ENCOUNTER — Other Ambulatory Visit: Payer: Self-pay

## 2020-03-02 DIAGNOSIS — I2510 Atherosclerotic heart disease of native coronary artery without angina pectoris: Secondary | ICD-10-CM

## 2020-03-02 NOTE — Telephone Encounter (Signed)
Contacted patient and verbalized to patient that his CT had not been resulted yet and he would be contacted once it had been. He voiced understanding.

## 2020-03-02 NOTE — Telephone Encounter (Signed)
New message    Patient called he has questions about he Cardiac Calcium Score results

## 2020-03-13 ENCOUNTER — Other Ambulatory Visit: Payer: Self-pay | Admitting: Cardiovascular Disease

## 2020-05-06 ENCOUNTER — Other Ambulatory Visit: Payer: Self-pay | Admitting: *Deleted

## 2020-05-06 MED ORDER — CLOBETASOL PROPIONATE 0.05 % EX CREA: TOPICAL_CREAM | Freq: Two times a day (BID) | CUTANEOUS | 0 refills | Status: DC

## 2020-08-04 ENCOUNTER — Telehealth: Payer: Self-pay

## 2020-08-04 MED ORDER — ARMOUR THYROID 60 MG PO TABS: 60.0000 mg | ORAL_TABLET | Freq: Every day | ORAL | 2 refills | Status: DC

## 2020-08-04 NOTE — Telephone Encounter (Signed)
Pt called in wanted to see if he could now get this med ARMOUR THYROID 60 MG tablet prescribed from Dr. Dennard Schaumann. Pt stated that he was seeing another dr for this prescription and will soon be out of this med. Please advise  Cb#: [phone]

## 2020-08-04 NOTE — Telephone Encounter (Signed)
Prescription sent to pharmacy.

## 2020-08-04 NOTE — Telephone Encounter (Signed)
Ok to take over prescription?

## 2020-08-30 ENCOUNTER — Other Ambulatory Visit: Payer: 59

## 2020-08-30 ENCOUNTER — Other Ambulatory Visit: Payer: Self-pay

## 2020-08-30 DIAGNOSIS — Z125 Encounter for screening for malignant neoplasm of prostate: Secondary | ICD-10-CM

## 2020-08-30 DIAGNOSIS — Z136 Encounter for screening for cardiovascular disorders: Secondary | ICD-10-CM

## 2020-08-30 DIAGNOSIS — Z13220 Encounter for screening for lipoid disorders: Secondary | ICD-10-CM

## 2020-08-30 DIAGNOSIS — Z1159 Encounter for screening for other viral diseases: Secondary | ICD-10-CM

## 2020-08-30 DIAGNOSIS — E291 Testicular hypofunction: Secondary | ICD-10-CM

## 2020-08-30 DIAGNOSIS — E039 Hypothyroidism, unspecified: Secondary | ICD-10-CM

## 2020-08-31 LAB — CBC WITH DIFFERENTIAL/PLATELET
Absolute Monocytes: 688 cells/uL (ref 200–950)
Basophils Absolute: 110 cells/uL (ref 0–200)
Basophils Relative: 2 %
Eosinophils Absolute: 281 cells/uL (ref 15–500)
Eosinophils Relative: 5.1 %
HCT: 45.6 % (ref 38.5–50.0)
Hemoglobin: 15.5 g/dL (ref 13.2–17.1)
Lymphs Abs: 1645 cells/uL (ref 850–3900)
MCH: 31.7 pg (ref 27.0–33.0)
MCHC: 34 g/dL (ref 32.0–36.0)
MCV: 93.3 fL (ref 80.0–100.0)
MPV: 12.6 fL — ABNORMAL HIGH (ref 7.5–12.5)
Monocytes Relative: 12.5 %
Neutro Abs: 2778 cells/uL (ref 1500–7800)
Neutrophils Relative %: 50.5 %
Platelets: 220 10*3/uL (ref 140–400)
RBC: 4.89 10*6/uL (ref 4.20–5.80)
RDW: 12.4 % (ref 11.0–15.0)
Total Lymphocyte: 29.9 %
WBC: 5.5 10*3/uL (ref 3.8–10.8)

## 2020-08-31 LAB — COMPLETE METABOLIC PANEL WITH GFR
AG Ratio: 1.5 (calc) (ref 1.0–2.5)
ALT: 12 U/L (ref 9–46)
AST: 11 U/L (ref 10–35)
Albumin: 4.2 g/dL (ref 3.6–5.1)
Alkaline phosphatase (APISO): 67 U/L (ref 35–144)
BUN: 12 mg/dL (ref 7–25)
CO2: 24 mmol/L (ref 20–32)
Calcium: 9.3 mg/dL (ref 8.6–10.3)
Chloride: 103 mmol/L (ref 98–110)
Creat: 0.86 mg/dL (ref 0.70–1.30)
Globulin: 2.8 g/dL (calc) (ref 1.9–3.7)
Glucose, Bld: 97 mg/dL (ref 65–99)
Potassium: 4.1 mmol/L (ref 3.5–5.3)
Sodium: 139 mmol/L (ref 135–146)
Total Bilirubin: 0.7 mg/dL (ref 0.2–1.2)
Total Protein: 7 g/dL (ref 6.1–8.1)
eGFR: 102 mL/min/{1.73_m2} (ref >=60)

## 2020-08-31 LAB — LIPID PANEL
Cholesterol: 165 mg/dL (ref <200)
HDL: 38 mg/dL — ABNORMAL LOW (ref >=40)
LDL Cholesterol (Calc): 110 mg/dL (calc) — ABNORMAL HIGH
Non-HDL Cholesterol (Calc): 127 mg/dL (calc) (ref <130)
Total CHOL/HDL Ratio: 4.3 (calc) (ref <5.0)
Triglycerides: 84 mg/dL (ref <150)

## 2020-08-31 LAB — TSH: TSH: 3.18 mIU/L (ref 0.40–4.50)

## 2020-08-31 LAB — TESTOSTERONE: Testosterone: 402 ng/dL (ref 250–827)

## 2020-08-31 LAB — HEPATITIS C ANTIBODY
Hepatitis C Ab: NONREACTIVE
SIGNAL TO CUT-OFF: 0.01 (ref <1.00)

## 2020-08-31 LAB — PSA: PSA: 0.3 ng/mL (ref <=4.00)

## 2020-09-02 ENCOUNTER — Ambulatory Visit (INDEPENDENT_AMBULATORY_CARE_PROVIDER_SITE_OTHER): Payer: 59 | Admitting: Family Medicine

## 2020-09-02 ENCOUNTER — Encounter: Payer: Self-pay | Admitting: Family Medicine

## 2020-09-02 ENCOUNTER — Other Ambulatory Visit: Payer: Self-pay

## 2020-09-02 VITALS — BP 124/66 | HR 92 | Temp 99.0°F | Resp 14 | Ht 69.0 in | Wt 191.0 lb

## 2020-09-02 DIAGNOSIS — Z0000 Encounter for general adult medical examination without abnormal findings: Secondary | ICD-10-CM

## 2020-09-02 MED ORDER — ALPRAZOLAM 0.5 MG PO TABS: 0.5000 mg | ORAL_TABLET | Freq: Every evening | ORAL | 0 refills | Status: DC | PRN

## 2020-09-02 NOTE — Progress Notes (Signed)
Subjective:    Patient ID: Brian Guzman, male    DOB: 05/07/1965, 55 y.o.   MRN: [id number]  HPI Patient is here today for complete physical exam.  Patient was receiving treatment for hypogonadism and hypothyroidism from another physician.  He has since stopped going to that physician and has stopped testosterone replacement.  His testosterone level still remains normal at over 400.  He is still on Armour Thyroid for hypothyroidism but he questions whether he needs that.  On 60 mg of Armour Thyroid, his TSH is normal at greater than 3.  Patient states that he would like to try to stop the thyroid replacement and then recheck a TSH in 6 to 8 weeks to see if this is truly necessary because he never felt bad or felt like he had any symptoms that warranted therapy.  I believe that is reasonable.  Patient has a 5% 10-year risk of cardiovascular disease based on his cholesterol below however he had a cardiac CT scan that showed a calcium score of 0.  Therefore I do not feel any indication to treat his mildly elevated cholesterol.  His last colonoscopy was in 2018 and is due again in 2028 Lab on 08/30/2020  Component Date Value Ref Range Status   WBC 08/30/2020 5.5  3.8 - 10.8 Thousand/uL Final   RBC 08/30/2020 4.89  4.20 - 5.80 Million/uL Final   Hemoglobin 08/30/2020 15.5  13.2 - 17.1 g/dL Final   HCT 08/30/2020 45.6  38.5 - 50.0 % Final   MCV 08/30/2020 93.3  80.0 - 100.0 fL Final   MCH 08/30/2020 31.7  27.0 - 33.0 pg Final   MCHC 08/30/2020 34.0  32.0 - 36.0 g/dL Final   RDW 08/30/2020 12.4  11.0 - 15.0 % Final   Platelets 08/30/2020 220  140 - 400 Thousand/uL Final   MPV 08/30/2020 12.6 (A) 7.5 - 12.5 fL Final   Neutro Abs 08/30/2020 2,778  1,500 - 7,800 cells/uL Final   Lymphs Abs 08/30/2020 1,645  850 - 3,900 cells/uL Final   Absolute Monocytes 08/30/2020 688  200 - 950 cells/uL Final   Eosinophils Absolute 08/30/2020 281  15 - 500 cells/uL Final   Basophils Absolute 08/30/2020 110  0 -  200 cells/uL Final   Neutrophils Relative % 08/30/2020 50.5  % Final   Total Lymphocyte 08/30/2020 29.9  % Final   Monocytes Relative 08/30/2020 12.5  % Final   Eosinophils Relative 08/30/2020 5.1  % Final   Basophils Relative 08/30/2020 2.0  % Final   Glucose, Bld 08/30/2020 97  65 - 99 mg/dL Final   Comment: .            Fasting reference interval .    BUN 08/30/2020 12  7 - 25 mg/dL Final   Creat 08/30/2020 0.86  0.70 - 1.30 mg/dL Final   eGFR 08/30/2020 102  > OR = 60 mL/min/1.5m Final   Comment: The eGFR is based on the CKD-EPI 2021 equation. To calculate  the new eGFR from a previous Creatinine or Cystatin C result, go to https://www.kidney.org/professionals/ kdoqi/gfr%5Fcalculator    BUN/Creatinine Ratio 041/63/8453NOT APPLICABLE  6 - 22 (calc) Final   Sodium 08/30/2020 139  135 - 146 mmol/L Final   Potassium 08/30/2020 4.1  3.5 - 5.3 mmol/L Final   Chloride 08/30/2020 103  98 - 110 mmol/L Final   CO2 08/30/2020 24  20 - 32 mmol/L Final   Calcium 08/30/2020 9.3  8.6 - 10.3 mg/dL Final  Total Protein 08/30/2020 7.0  6.1 - 8.1 g/dL Final   Albumin 08/30/2020 4.2  3.6 - 5.1 g/dL Final   Globulin 08/30/2020 2.8  1.9 - 3.7 g/dL (calc) Final   AG Ratio 08/30/2020 1.5  1.0 - 2.5 (calc) Final   Total Bilirubin 08/30/2020 0.7  0.2 - 1.2 mg/dL Final   Alkaline phosphatase (APISO) 08/30/2020 67  35 - 144 U/L Final   AST 08/30/2020 11  10 - 35 U/L Final   ALT 08/30/2020 12  9 - 46 U/L Final   Hepatitis C Ab 08/30/2020 NON-REACTIVE  NON-REACTIVE Final   SIGNAL TO CUT-OFF 08/30/2020 0.01  <1.00 Final   Comment: . HCV antibody was non-reactive. There is no laboratory  evidence of HCV infection. . In most cases, no further action is required. However, if recent HCV exposure is suspected, a test for HCV RNA (test code [phone]) is suggested. . For additional information please refer to http://education.quest[facility].com/faq/FAQ22v1 (This link is being provided for  informational/ educational purposes only.) .    Cholesterol 08/30/2020 165  <200 mg/dL Final   HDL 08/30/2020 38 (A) > OR = 40 mg/dL Final   Triglycerides 08/30/2020 84  <150 mg/dL Final   LDL Cholesterol (Calc) 08/30/2020 110 (A) mg/dL (calc) Final   Comment: Reference range: <100 . Desirable range <100 mg/dL for primary prevention;   <70 mg/dL for patients with CHD or diabetic patients  with > or = 2 CHD risk factors. Marland Kitchen LDL-C is now calculated using the Martin-Hopkins  calculation, which is a validated novel method providing  better accuracy than the Friedewald equation in the  estimation of LDL-C.  Cresenciano Genre et al. Annamaria Helling. 7048;889(16): 2061-2068  (http://education.[facility].com/faq/FAQ164)    Total CHOL/HDL Ratio 08/30/2020 4.3  <5.0 (calc) Final   Non-HDL Cholesterol (Calc) 08/30/2020 127  <130 mg/dL (calc) Final   Comment: For patients with diabetes plus 1 major ASCVD risk  factor, treating to a non-HDL-C goal of <100 mg/dL  (LDL-C of <70 mg/dL) is considered a therapeutic  option.    PSA 08/30/2020 0.30  < OR = 4.00 ng/mL Final   Comment: The total PSA value from this assay system is  standardized against the WHO standard. The test  result will be approximately 20% lower when compared  to the equimolar-standardized total PSA (Beckman  Coulter). Comparison of serial PSA results should be  interpreted with this fact in mind. . This test was performed using the Siemens  chemiluminescent method. Values obtained from  different assay methods cannot be used interchangeably. PSA levels, regardless of value, should not be interpreted as absolute evidence of the presence or absence of disease.    TSH 08/30/2020 3.18  0.40 - 4.50 mIU/L Final   Testosterone 08/30/2020 402  250 - 827 ng/dL Final   Immunization History  Administered Date(s) Administered   PFIZER(Purple Top)SARS-COV-2 Vaccination 09/15/2019, 01/05/2020   Tdap 04/22/2017   Zoster Recombinat (Shingrix)  04/16/2016, 08/28/2016   Past Medical History:  Diagnosis Date   Hypogonadism in male    Hypothyroidism    White coat syndrome without diagnosis of hypertension    Past Surgical History:  Procedure Laterality Date   BREAST SURGERY     removed breast tissue   COLONOSCOPY N/A 09/12/2016   Procedure: COLONOSCOPY;  Surgeon: Rogene Houston, MD;  [facility];  Service: Endoscopy;  Laterality: N/A;  [place]     gyecomastia reduction   LUMBAR LAMINECTOMY/DECOMPRESSION MICRODISCECTOMY Right 11/17/2014   Procedure:  CENTRAL DECOMPRESSIVE LUMBAR LAMINECTOMY AT L4-L5.  MICRODISCECTOMY AT L4-L5 ON RIGHT.  FORAMINOTOMY FOR THE L4 AND L5 NERVE ROOT ON THE RIGHT.;  Surgeon: Latanya Maudlin, MD;  Location: [facility];  Service: Orthopedics;  Laterality: Right;   TONSILLECTOMY     Current Outpatient Medications on File Prior to Visit  Medication Sig Dispense Refill   ARMOUR THYROID 60 MG tablet Take 1 tablet (60 mg total) by mouth daily. 90 tablet 2   clobetasol cream (TEMOVATE) 0.05 % Apply topically 2 (two) times daily. 30 g 0   finasteride (PROSCAR) 5 MG tablet Take 5 mg by mouth daily.     minoxidil (LONITEN) 2.5 MG tablet Take 1.25 mg by mouth every evening.     propranolol (INDERAL) 10 MG tablet TAKE 1 TABLET (10 MG TOTAL) BY MOUTH AS NEEDED (TAKE DAILY AS NEEDED FOR PALPITATIONS). 30 tablet 3   No current facility-administered medications on file prior to visit.   No Known Allergies Social History   Socioeconomic History   Marital status: Single    Spouse name: Not on file   Number of children: Not on file   Years of education: Not on file   Highest education level: Not on file  Occupational History   Not on file  Tobacco Use   Smoking status: Never   Smokeless tobacco: Never  Vaping Use   Vaping Use: Never used  Substance and Sexual Activity   Alcohol use: No   Drug use: No   Sexual activity: Not on file  Other Topics Concern   Not on file  Social  History Narrative   Not on file   Social Determinants of Health   Financial Resource Strain: Not on file  Food Insecurity: Not on file  Transportation Needs: Not on file  Physical Activity: Not on file  Stress: Not on file  Social Connections: Not on file  Intimate Partner Violence: Not on file   Family History  Problem Relation Age of Onset   Hypertension Mother    Kidney disease Mother    Heart disease Mother    Hypertension Father    Colon cancer Neg Hx    Mother had a history of congestive heart failure and renal failure. Father has a history of hypertension   Review of Systems  All other systems reviewed and are negative.     Objective:   Physical Exam Vitals reviewed.  Constitutional:      General: He is not in acute distress.    Appearance: He is well-developed. He is not diaphoretic.  HENT:     Head: Normocephalic and atraumatic.     Right Ear: External ear normal.     Left Ear: External ear normal.     Nose: Nose normal.     Mouth/Throat:     Pharynx: No oropharyngeal exudate.  Eyes:     General: No scleral icterus.       Right eye: No discharge.        Left eye: No discharge.     Conjunctiva/sclera: Conjunctivae normal.     Pupils: Pupils are equal, round, and reactive to light.  Neck:     Thyroid: No thyromegaly.     Vascular: No JVD.     Trachea: No tracheal deviation.  Cardiovascular:     Rate and Rhythm: Normal rate and regular rhythm.     Heart sounds: Normal heart sounds. No murmur heard.   No friction rub. No gallop.  Pulmonary:  Effort: Pulmonary effort is normal. No respiratory distress.     Breath sounds: Normal breath sounds. No stridor. No wheezing or rales.  Chest:     Chest wall: No tenderness.  Abdominal:     General: Bowel sounds are normal. There is no distension.     Palpations: Abdomen is soft. There is no mass.     Tenderness: There is no abdominal tenderness. There is no guarding or rebound.  Musculoskeletal:         General: No tenderness. Normal range of motion.     Cervical back: Normal range of motion and neck supple.  Lymphadenopathy:     Cervical: No cervical adenopathy.  Skin:    General: Skin is warm.     Coloration: Skin is not pale.     Findings: No erythema or rash.  Neurological:     Mental Status: He is alert and oriented to person, place, and time.     Cranial Nerves: No cranial nerve deficit.     Motor: No abnormal muscle tone.     Coordination: Coordination normal.     Deep Tendon Reflexes: Reflexes are normal and symmetric.  Psychiatric:        Behavior: Behavior normal.        Thought Content: Thought content normal.        Judgment: Judgment normal.          Assessment & Plan:   General medical exam Physical exam today is completely normal.  Blood pressure is outstanding.  Lab work is perfect.  Patient is due for a flu shot as well as a booster on his COVID-vaccine.  I discussed this with him but he declines these at the present time.  Colonoscopy is due again in 2028.  Prostate cancer screening test is completely normal.  Lab work is excellent.  He is having some issues with trouble sleeping.  His girlfriend recently had a massive heart attack.  He also lost a pet that has been his cat for 15 years.  As a result he is having a difficult time sleeping and would like to try Xanax at least in the short run 2-3 times a week only as needed to help him sleep.  I believe this is entirely appropriate.

## 2020-10-07 ENCOUNTER — Other Ambulatory Visit: Payer: Self-pay | Admitting: Family Medicine

## 2020-10-07 NOTE — Telephone Encounter (Signed)
Ok to refill??  Last office visit/ refill 09/02/2020.  Ok to add refills to prescription?

## 2020-10-24 ENCOUNTER — Other Ambulatory Visit: Payer: Self-pay

## 2020-10-24 ENCOUNTER — Other Ambulatory Visit: Payer: 59

## 2020-10-24 ENCOUNTER — Other Ambulatory Visit: Payer: Self-pay | Admitting: *Deleted

## 2020-10-24 ENCOUNTER — Other Ambulatory Visit: Payer: Self-pay | Admitting: Family Medicine

## 2020-10-24 ENCOUNTER — Telehealth: Payer: Self-pay | Admitting: *Deleted

## 2020-10-24 DIAGNOSIS — E039 Hypothyroidism, unspecified: Secondary | ICD-10-CM

## 2020-10-24 LAB — T4, FREE: Free T4: 1.1 ng/dL (ref 0.8–1.8)

## 2020-10-24 LAB — T3, FREE: T3, Free: 4.3 pg/mL — ABNORMAL HIGH (ref 2.3–4.2)

## 2020-10-24 LAB — TSH: TSH: 3.87 mIU/L (ref 0.40–4.50)

## 2020-10-24 MED ORDER — LOSARTAN POTASSIUM-HCTZ 100-25 MG PO TABS: 1.0000 | ORAL_TABLET | Freq: Every day | ORAL | 1 refills | Status: DC

## 2020-10-24 NOTE — Telephone Encounter (Signed)
Patient returned call and clarified that he is taking Losartan HCTZ 100-25mg .   Also states that he will stop by to repeat thyroid labs.

## 2020-10-24 NOTE — Telephone Encounter (Signed)
Received call from patient.   Reports that he was advised to stop Armour Thyroid 60mg  at last OV on 09/02/2020.  States that he has been checking his BP since stopping the thyroid medication and noted that systolic BP has been elevated. States that numbers will consistently run 120- 150.  Reports that he starting taking some old losartan and BP noted WNL. States that reading after losartan today noted at 114/77.  Please advise.

## 2021-01-24 ENCOUNTER — Ambulatory Visit: Payer: 59 | Admitting: Family Medicine

## 2021-01-24 ENCOUNTER — Other Ambulatory Visit: Payer: Self-pay

## 2021-01-24 DIAGNOSIS — R300 Dysuria: Secondary | ICD-10-CM

## 2021-01-24 LAB — URINALYSIS, ROUTINE W REFLEX MICROSCOPIC
Bilirubin Urine: NEGATIVE
Glucose, UA: NEGATIVE
Hgb urine dipstick: NEGATIVE
Ketones, ur: NEGATIVE
Leukocytes,Ua: NEGATIVE
Nitrite: NEGATIVE
Protein, ur: NEGATIVE
Specific Gravity, Urine: 1.015 (ref 1.001–1.035)
pH: 7.5 (ref 5.0–8.0)

## 2021-01-26 NOTE — Progress Notes (Signed)
error 

## 2021-02-25 ENCOUNTER — Other Ambulatory Visit: Payer: Self-pay | Admitting: Family Medicine

## 2021-03-02 ENCOUNTER — Other Ambulatory Visit: Payer: Self-pay

## 2021-03-02 ENCOUNTER — Encounter: Payer: Self-pay | Admitting: Family Medicine

## 2021-03-02 ENCOUNTER — Ambulatory Visit: Payer: 59 | Admitting: Family Medicine

## 2021-03-02 VITALS — BP 120/82 | HR 73 | Temp 97.0°F | Resp 18 | Ht 69.0 in | Wt 202.0 lb

## 2021-03-02 DIAGNOSIS — M25561 Pain in right knee: Secondary | ICD-10-CM

## 2021-03-02 MED ORDER — DICLOFENAC SODIUM 75 MG PO TBEC: 75.0000 mg | DELAYED_RELEASE_TABLET | Freq: Two times a day (BID) | ORAL | 0 refills | Status: DC

## 2021-03-02 NOTE — Progress Notes (Signed)
? ?Subjective:  ? ? Patient ID: Brian Guzman, male    DOB: [birth year], 56 y.o.   MRN: [id number] ? ?HPI ?Recently, the patient's coworker suffered a heart attack in the parking lot.  He ran about a mile across hard concrete and asphalt to assist his coworker.  Shortly thereafter he developed pain in both of his knees, right greater than left.  He denies any instability in the knee.  He denies any laxity to varus or valgus stress.  He has a negative anterior posterior drawer sign laterally however he does have significant pain and tenderness over the medial joint line both anteriorly and posteriorly.  With Apley grind, I can elicit pain over the medial joint line posteriorly.  He also has a small effusion. ?Past Medical History:  ?Diagnosis Date  ? Hypogonadism in male   ? Hypothyroidism   ? White coat syndrome without diagnosis of hypertension   ? ?Past Surgical History:  ?Procedure Laterality Date  ? BREAST SURGERY    ? removed breast tissue  ? COLONOSCOPY N/A 09/12/2016  ? Procedure: COLONOSCOPY;  Surgeon: Rogene Houston, MD;  Location: [facility];  Service: Endoscopy;  Laterality: N/A;  830  ? COSMETIC SURGERY    ? gyecomastia reduction  ? LUMBAR LAMINECTOMY/DECOMPRESSION MICRODISCECTOMY Right 11/17/2014  ? Procedure: CENTRAL DECOMPRESSIVE LUMBAR LAMINECTOMY AT L4-L5.  MICRODISCECTOMY AT L4-L5 ON RIGHT.  FORAMINOTOMY FOR THE L4 AND L5 NERVE ROOT ON THE RIGHT.;  Surgeon: Latanya Maudlin, MD;  Location: [facility];  Service: Orthopedics;  Laterality: Right;  ? TONSILLECTOMY    ? ?Current Outpatient Medications on File Prior to Visit  ?Medication Sig Dispense Refill  ? ALPRAZolam (XANAX) 0.5 MG tablet TAKE 1 TABLET (0.5 MG TOTAL) BY MOUTH AT BEDTIME AS NEEDED FOR SLEEP. 30 tablet 3  ? ARMOUR THYROID 60 MG tablet Take 1 tablet (60 mg total) by mouth daily. 90 tablet 2  ? clobetasol cream (TEMOVATE) 0.05 % Apply topically 2 (two) times daily. 30 g 0  ? finasteride (PROSCAR) 5 MG tablet Take 5 mg by mouth daily.    ?  losartan-hydrochlorothiazide (HYZAAR) 100-25 MG tablet TAKE 1 TABLET BY MOUTH EVERY DAY 90 tablet 1  ? minoxidil (LONITEN) 2.5 MG tablet Take 1.25 mg by mouth every evening.    ? ?No current facility-administered medications on file prior to visit.  ? ?No Known Allergies ?Social History  ? ?Socioeconomic History  ? Marital status: Single  ?  Spouse name: Not on file  ? Number of children: Not on file  ? Years of education: Not on file  ? Highest education level: Not on file  ?Occupational History  ? Not on file  ?Tobacco Use  ? Smoking status: Never  ? Smokeless tobacco: Never  ?Vaping Use  ? Vaping Use: Never used  ?Substance and Sexual Activity  ? Alcohol use: No  ? Drug use: No  ? Sexual activity: Not on file  ?Other Topics Concern  ? Not on file  ?Social History Narrative  ? Not on file  ? ?Social Determinants of Health  ? ?Financial Resource Strain: Not on file  ?Food Insecurity: Not on file  ?Transportation Needs: Not on file  ?Physical Activity: Not on file  ?Stress: Not on file  ?Social Connections: Not on file  ?Intimate Partner Violence: Not on file  ? ?Family History  ?Problem Relation Age of Onset  ? Hypertension Mother   ? Kidney disease Mother   ? Heart disease Mother   ?  Hypertension Father   ? Colon cancer Neg Hx   ? ? ? ?Review of Systems  ?All other systems reviewed and are negative. ? ?   ?Objective:  ? Physical Exam ?Vitals reviewed.  ?Constitutional:   ?   General: He is not in acute distress. ?   Appearance: He is well-developed. He is not diaphoretic.  ?HENT:  ?   Head: Normocephalic and atraumatic.  ?Neck:  ?   Thyroid: No thyromegaly.  ?   Vascular: No JVD.  ?   Trachea: No tracheal deviation.  ?Cardiovascular:  ?   Rate and Rhythm: Normal rate. Rhythm irregular.  ?   Heart sounds: Normal heart sounds. No murmur heard. ?  No friction rub. No gallop.  ?Pulmonary:  ?   Effort: Pulmonary effort is normal. No respiratory distress.  ?   Breath sounds: Normal breath sounds. No stridor. No wheezing  or rales.  ?Chest:  ?   Chest wall: No tenderness.  ?Musculoskeletal:  ?   Right knee: Effusion present. No erythema. Decreased range of motion. Tenderness present over the medial joint line. Abnormal meniscus.  ?   Left knee: Normal range of motion. No tenderness. No medial joint line or lateral joint line tenderness. Normal meniscus.  ?Neurological:  ?   Mental Status: He is alert.  ?   Motor: No abnormal muscle tone.  ?   Deep Tendon Reflexes: Reflexes are normal and symmetric.  ? ? ? ? ? ?   ?Assessment & Plan:  ?Acute pain of right knee ?I believe the patient likely irritated the cartilage in his knee and possibly even tore a meniscus when he was running.  We will try diclofenac 75 mg twice daily.  If not improving over 1 to 2 weeks we can try a cortisone injection next.  If not beneficial, the neck step would be an MRI to see if there is a meniscal tear. ?

## 2021-04-07 ENCOUNTER — Other Ambulatory Visit: Payer: Self-pay | Admitting: Family Medicine

## 2021-04-15 ENCOUNTER — Other Ambulatory Visit: Payer: Self-pay | Admitting: Family Medicine

## 2021-05-02 ENCOUNTER — Other Ambulatory Visit: Payer: Self-pay | Admitting: Family Medicine

## 2021-05-02 NOTE — Telephone Encounter (Signed)
Requested medication (s) are due for refill today: yes ? ?Requested medication (s) are on the active medication list: yes ? ?Last refill:  10/07/20 #30/3 ? ?Future visit scheduled: yes ? ?Notes to clinic:  Unable to refill per protocol, cannot delegate. ? ? ? ?  ?Requested Prescriptions  ?Pending Prescriptions Disp Refills  ? ALPRAZolam (XANAX) 0.5 MG tablet [Pharmacy Med Name: ALPRAZOLAM 0.5 MG TABLET] 30 tablet   ?  Sig: TAKE 1 TABLET (0.5 MG TOTAL) BY MOUTH AT BEDTIME AS NEEDED FOR SLEEP.  ?  ? Not Delegated - Psychiatry: Anxiolytics/Hypnotics 2 Failed - 05/02/2021  7:24 AM  ?  ?  Failed - This refill cannot be delegated  ?  ?  Failed - Urine Drug Screen completed in last 360 days  ?  ?  Passed - Patient is not pregnant  ?  ?  Passed - Valid encounter within last 6 months  ?  Recent Outpatient Visits   ? ?      ? 2 months ago Acute pain of right knee  ? [facility] Pickard, Cammie Mcgee, MD  ? 3 months ago Dysuria  ? [place] Pickard, Cammie Mcgee, MD  ? 8 months ago General medical exam  ? [facility] Susy Frizzle, MD  ? 1 year ago Palpitations  ? [facility] Pickard, Cammie Mcgee, MD  ? 1 year ago General medical exam  ? [facility] Susy Frizzle, MD  ? ?  ?  ? ? ?  ?  ?  ? ?

## 2021-05-04 NOTE — Telephone Encounter (Signed)
LOV 03/02/21 ?Last refill 10/04/20, #30, 3 refills ? ?Please review, thanks! ? ?

## 2021-05-16 ENCOUNTER — Other Ambulatory Visit: Payer: Self-pay | Admitting: Family Medicine

## 2021-05-17 NOTE — Telephone Encounter (Signed)
Requested Prescriptions  ?Pending Prescriptions Disp Refills  ?? diclofenac (VOLTAREN) 75 MG EC tablet [Pharmacy Med Name: DICLOFENAC SOD EC 75 MG TAB] 60 tablet 0  ?  Sig: TAKE 1 TABLET BY MOUTH TWICE A DAY  ?  ? Analgesics:  NSAIDS Failed - 05/16/2021  2:05 AM  ?  ?  Failed - Manual Review: Labs are only required if the patient has taken medication for more than 8 weeks.  ?  ?  Passed - Cr in normal range and within 360 days  ?  Creat  ?Date Value Ref Range Status  ?08/30/2020 0.86 0.70 - 1.30 mg/dL Final  ?   ?  ?  Passed - HGB in normal range and within 360 days  ?  Hemoglobin  ?Date Value Ref Range Status  ?08/30/2020 15.5 13.2 - 17.1 g/dL Final  ?   ?  ?  Passed - PLT in normal range and within 360 days  ?  Platelets  ?Date Value Ref Range Status  ?08/30/2020 220 140 - 400 Thousand/uL Final  ?   ?  ?  Passed - HCT in normal range and within 360 days  ?  HCT  ?Date Value Ref Range Status  ?08/30/2020 45.6 38.5 - 50.0 % Final  ?   ?  ?  Passed - eGFR is 30 or above and within 360 days  ?  GFR, Est African American  ?Date Value Ref Range Status  ?07/15/2019 112 > OR = 60 mL/min/1.56m Final  ? ?GFR, Est Non African American  ?Date Value Ref Range Status  ?07/15/2019 97 > OR = 60 mL/min/1.72mFinal  ? ?eGFR  ?Date Value Ref Range Status  ?08/30/2020 102 > OR = 60 mL/min/1.7360minal  ?  Comment:  ?  The eGFR is based on the CKD-EPI 2021 equation. To calculate  ?the new eGFR from a previous Creatinine or Cystatin C ?result, go to https://www.kidney.org/professionals/ ?kdoqi/gfr%5Fcalculator ?  ?   ?  ?  Passed - Patient is not pregnant  ?  ?  Passed - Valid encounter within last 12 months  ?  Recent Outpatient Visits   ?      ? 2 months [facility]  ? 3 [place]  ? 8 months [facility]  ? 1 year [facility]ations  [facility]  ? 1 year [facility]  ?  ?  ? ?  ?  ?  ? ?

## 2021-05-18 ENCOUNTER — Ambulatory Visit: Payer: 59

## 2021-05-18 ENCOUNTER — Encounter: Payer: Self-pay | Admitting: Podiatry

## 2021-05-18 ENCOUNTER — Ambulatory Visit: Payer: 59 | Admitting: Podiatry

## 2021-05-18 DIAGNOSIS — Q828 Other specified congenital malformations of skin: Secondary | ICD-10-CM

## 2021-05-18 DIAGNOSIS — M7752 Other enthesopathy of left foot: Secondary | ICD-10-CM | POA: Diagnosis not present

## 2021-05-18 NOTE — Progress Notes (Signed)
SITUATION Reason for Consult: Evaluation for Bilateral Custom Foot Orthoses Patient / Caregiver Report: Patient is ready for foot orthotics  OBJECTIVE DATA: Patient History / Diagnosis:    ICD-10-CM   1. Capsulitis of metatarsophalangeal (MTP) joint of left foot  M77.52       Current or Previous Devices:   Historical user  Foot Examination: Skin presentation:   Intact Ulcers & Callousing:   5th met bilateral Toe / Foot Deformities:  Prominent met heads Weight Bearing Presentation:  Rectus Sensation:    Intact  Shoe Size:    9.19M  ORTHOTIC RECOMMENDATION Recommended Device: 1x pair of custom functional foot orthotics  GOALS OF ORTHOSES - Reduce Pain - Prevent Foot Deformity - Prevent Progression of Further Foot Deformity - Relieve Pressure - Improve the Overall Biomechanical Function of the Foot and Lower Extremity.  ACTIONS PERFORMED Potential out of pocket cost was communicated to patient. Patient understood and consent to casting. Patient was casted for Foot Orthoses via crush box. Procedure was explained and patient tolerated procedure well. Casts were shipped to central fabrication. All questions were answered and concerns addressed.  PLAN Patient is to be called for fitting when devices are ready.

## 2021-05-19 NOTE — Progress Notes (Signed)
Subjective:   Patient ID: Brian Guzman, male   DOB: 56 y.o.   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  Patient had casting done by pedorthist with offloading of the fifth metatarsal and will be seen back when those are returned all questions answered today hopefully we can avoid surgery

## 2021-06-29 ENCOUNTER — Ambulatory Visit (INDEPENDENT_AMBULATORY_CARE_PROVIDER_SITE_OTHER): Payer: 59

## 2021-06-29 DIAGNOSIS — M7752 Other enthesopathy of left foot: Secondary | ICD-10-CM

## 2021-06-29 NOTE — Progress Notes (Signed)
Patient presents today to pick up custom molded foot orthotics, diagnosed with capsulitis of MTP joint by Dr. Paulla Dolly.   Orthotics were dispensed and fit was satisfactory. Reviewed instructions for break-in and wear. Written instructions given to patient.  Patient will follow up as needed.   [facility] - order # [id number]

## 2021-06-30 IMAGING — CT CT CARDIAC CORONARY ARTERY CALCIUM SCORE
2 series · 16 of 20 positions shown, 18 images · non-contrast
Comparison: 11/10/2014 chest radiograph
COMPARISON: 11/10/2014 chest radiograph

Addendum:
EXAM:
OVER-READ INTERPRETATION  CT CHEST

The following report is an over-read performed by radiologist Dr.
Guicho Prince [REDACTED] on 03/02/2020. This over-read
does not include interpretation of cardiac or coronary anatomy or
pathology. The calcium score interpretation by the cardiologist is
attached.
CLINICAL DATA: Risk stratification
Coronary Calcium Score
TECHNIQUE: The patient was scanned on a Siemens Somatom 64 slice scanner. Axial
non-contrast 3 mm slices were carried out through the heart. The
data set was analyzed on a dedicated work station and scored using
the Agatson method.

[Series 3: 2 hrt calcium · axial · 0.42mm/px · z∈[+1216,+1350]mm · 8 of 59 slices shown, 10 images]
[im 7/59  vessel]
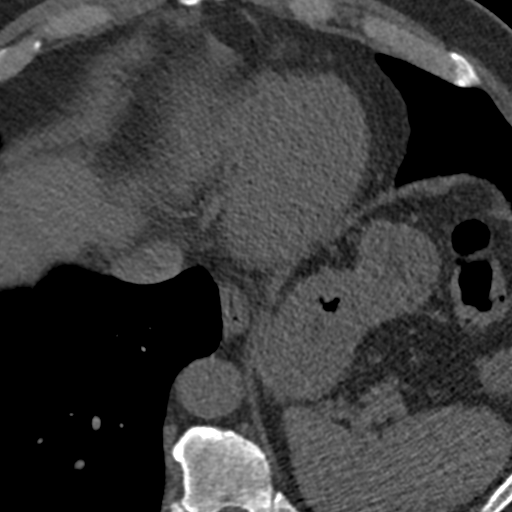
[im 7/59  lung]
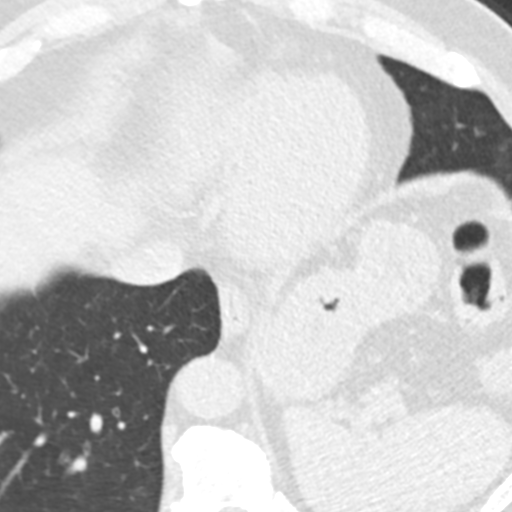
[im 13/59  vessel]
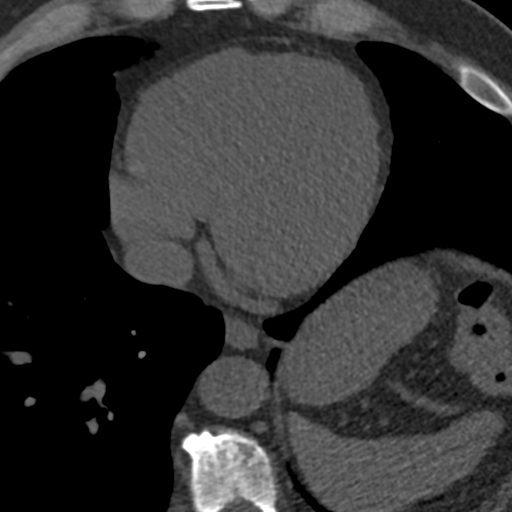
[im 20/59  vessel]
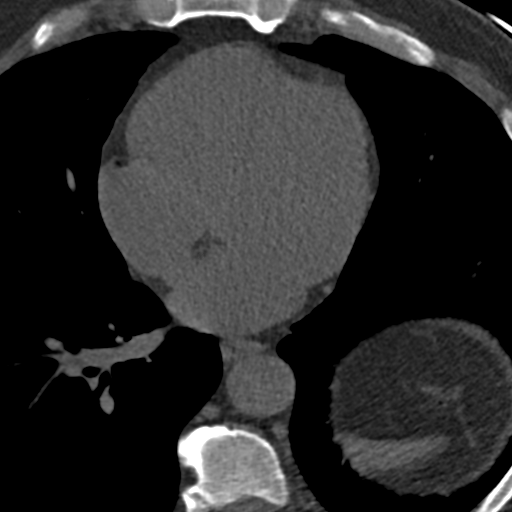
[im 26/59  vessel]
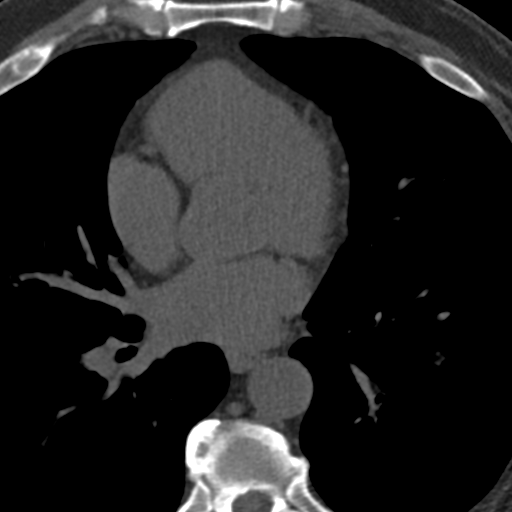
[im 33/59  vessel]
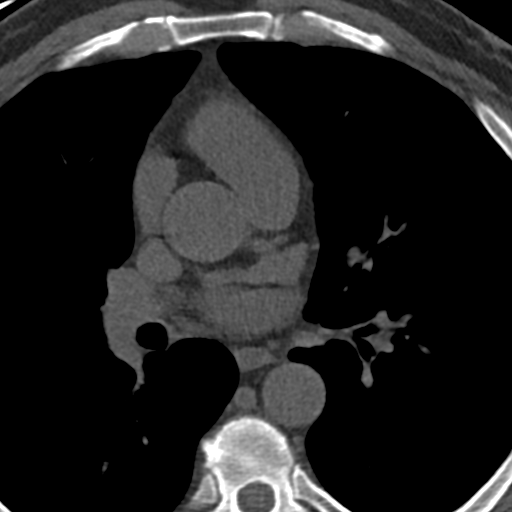
[im 33/59  lung]
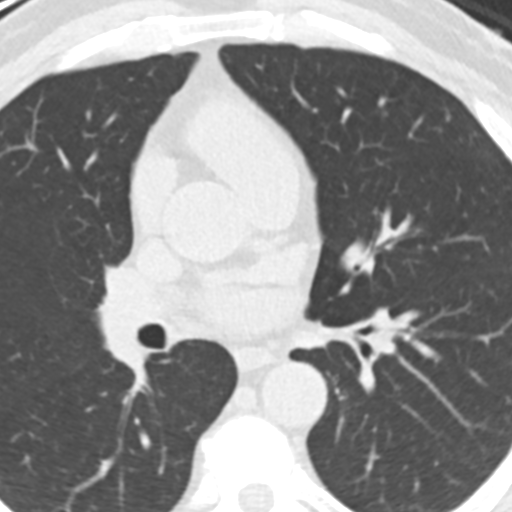
[im 39/59  vessel]
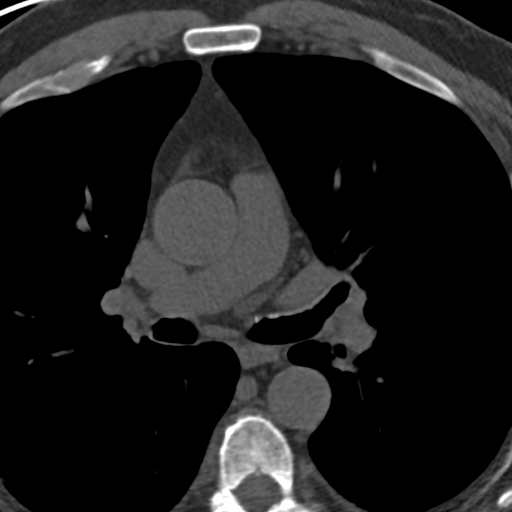
[im 46/59  vessel]
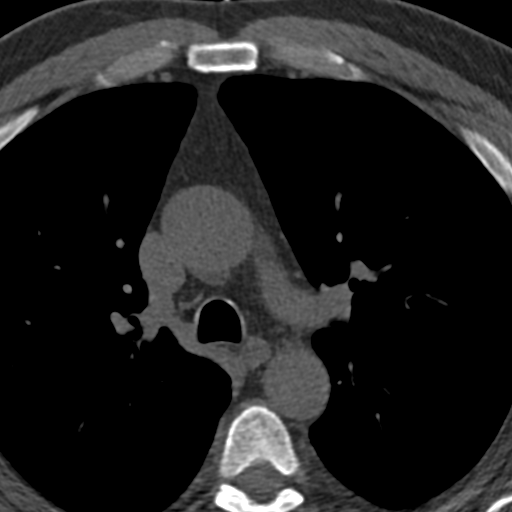
[im 52/59  vessel]
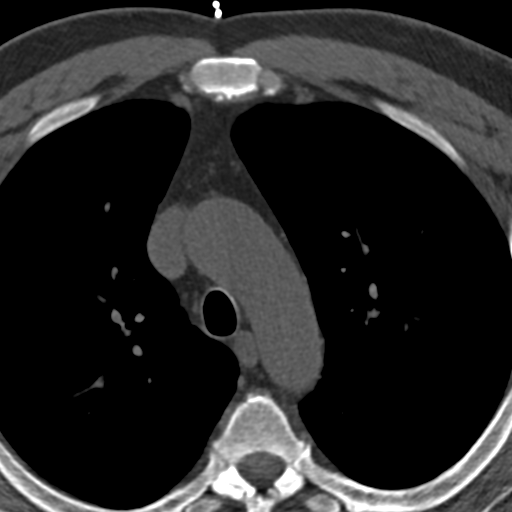

[Series 4: 2 soft full fov 71 % · axial · 0.86mm/px · z∈[+1210,+1348]mm · 8 of 60 slices shown]
[im 7/60  vessel]
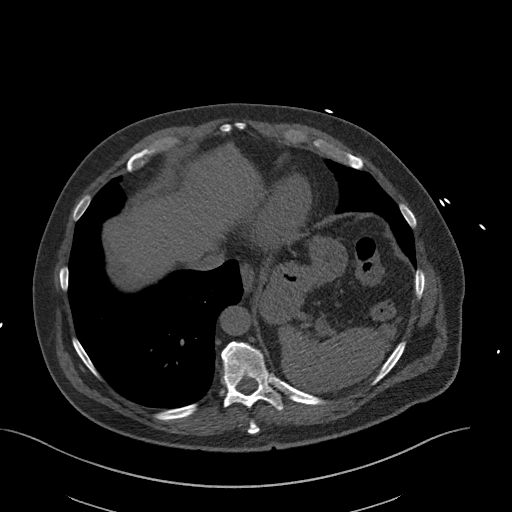
[im 14/60  vessel]
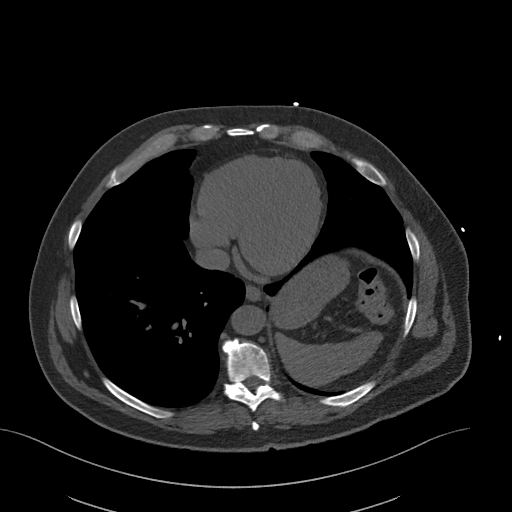
[im 20/60  vessel]
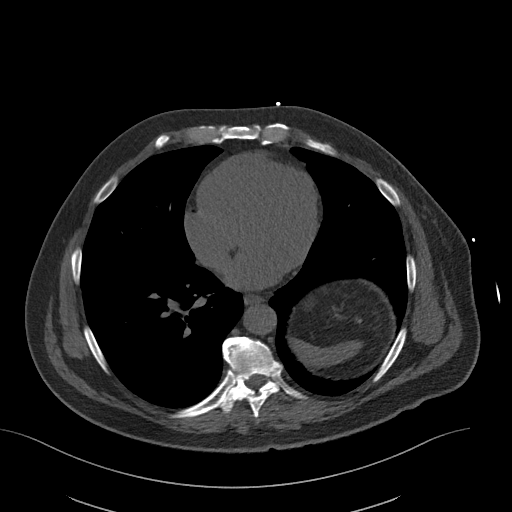
[im 27/60  vessel]
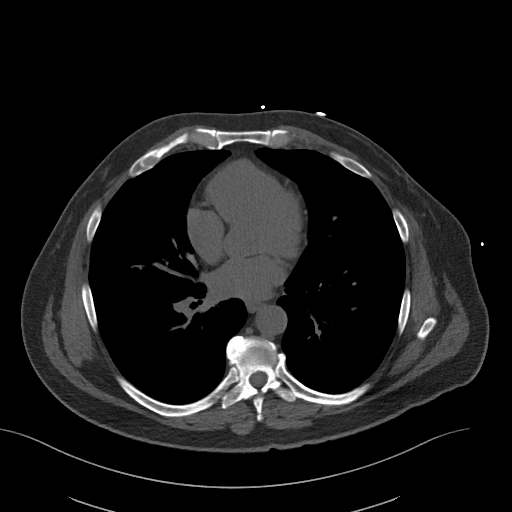
[im 33/60  vessel]
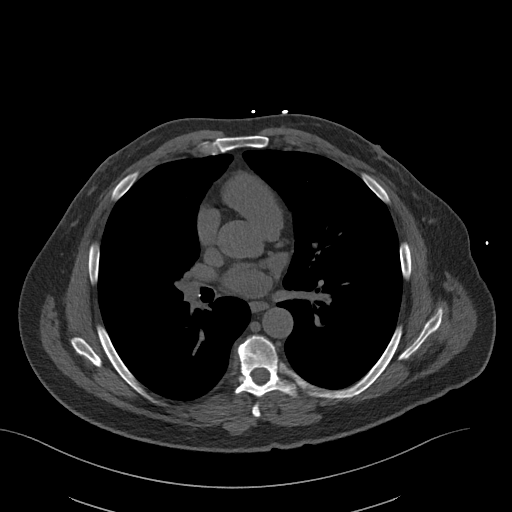
[im 40/60  vessel]
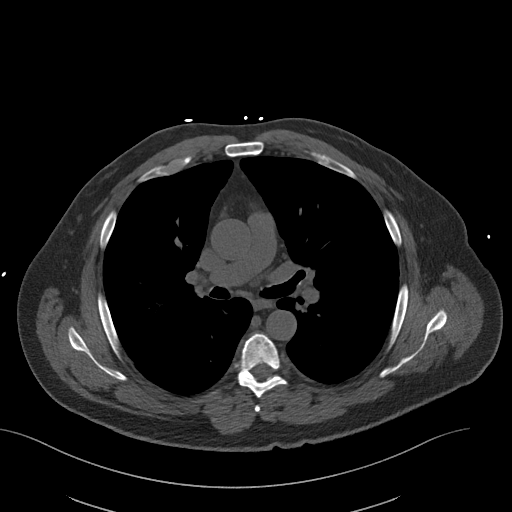
[im 46/60  vessel]
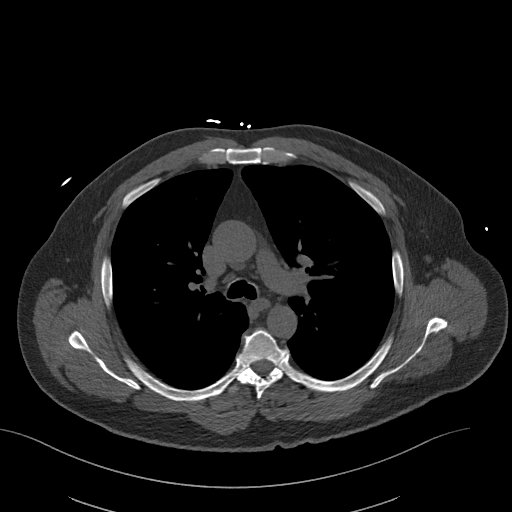
[im 53/60  vessel]
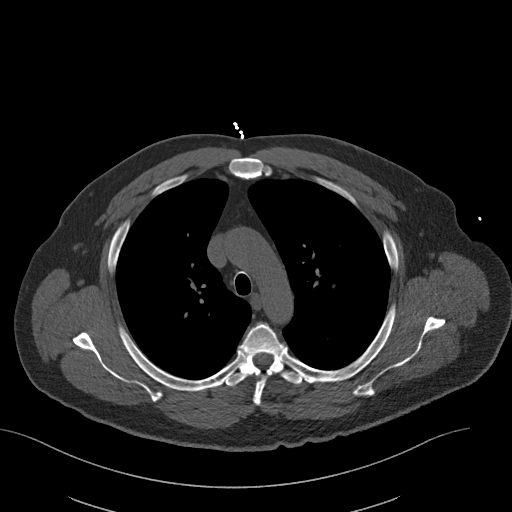

[16 of 20 positions shown; findings below may reference images not displayed]

FINDINGS: Vascular: Aortic atherosclerosis.  Tortuous thoracic aorta.

Mediastinum/Nodes: No imaged thoracic adenopathy.

Lungs/Pleura: No pleural fluid. Moderate left hemidiaphragm
elevation. 3 mm right lower lobe pulmonary nodule on 45/6. Mild
nodularity along the right major fissure, including on [DATE].

Left lower lobe calcified granuloma.

3 mm left lower lobe pulmonary nodule on [DATE].

Upper Abdomen: Normal imaged portions of the liver, spleen, stomach.

Musculoskeletal: No acute osseous abnormality.
IMPRESSION: 1.  No acute findings in the imaged extracardiac chest.
2. Bilateral pulmonary nodules of maximally 3 mm. No follow-up
needed if patient is low-risk. Non-contrast chest CT can be
considered in 12 months if patient is high-risk. This recommendation
follows the consensus statement: Guidelines for Management of
Incidental Pulmonary Nodules Detected on CT Images: From the
3.  Aortic Atherosclerosis (WGFVT-B0S.S).
FINDINGS: Non-cardiac: See separate report from [REDACTED].

Ascending aorta: Normal aortic root 3.3 cm

Pericardium: Normal

Coronary arteries: No calcium noted
IMPRESSION: Coronary calcium score of 0.

Polin Billiot

*** End of Addendum ***
EXAM:
OVER-READ INTERPRETATION  CT CHEST

The following report is an over-read performed by radiologist Dr.
Guicho Prince [REDACTED] on 03/02/2020. This over-read
does not include interpretation of cardiac or coronary anatomy or
pathology. The calcium score interpretation by the cardiologist is
attached.
FINDINGS: Vascular: Aortic atherosclerosis.  Tortuous thoracic aorta.

Mediastinum/Nodes: No imaged thoracic adenopathy.

Lungs/Pleura: No pleural fluid. Moderate left hemidiaphragm
elevation. 3 mm right lower lobe pulmonary nodule on 45/6. Mild
nodularity along the right major fissure, including on [DATE].

Left lower lobe calcified granuloma.

3 mm left lower lobe pulmonary nodule on [DATE].

Upper Abdomen: Normal imaged portions of the liver, spleen, stomach.

Musculoskeletal: No acute osseous abnormality.
IMPRESSION: 1.  No acute findings in the imaged extracardiac chest.
2. Bilateral pulmonary nodules of maximally 3 mm. No follow-up
needed if patient is low-risk. Non-contrast chest CT can be
considered in 12 months if patient is high-risk. This recommendation
follows the consensus statement: Guidelines for Management of
Incidental Pulmonary Nodules Detected on CT Images: From the
3.  Aortic Atherosclerosis (WGFVT-B0S.S).

## 2021-09-05 ENCOUNTER — Ambulatory Visit (INDEPENDENT_AMBULATORY_CARE_PROVIDER_SITE_OTHER): Payer: 59 | Admitting: Family Medicine

## 2021-09-05 ENCOUNTER — Encounter: Payer: Self-pay | Admitting: Family Medicine

## 2021-09-05 VITALS — BP 132/76 | HR 69 | Temp 97.2°F | Ht 69.0 in | Wt 196.0 lb

## 2021-09-05 DIAGNOSIS — Z0000 Encounter for general adult medical examination without abnormal findings: Secondary | ICD-10-CM

## 2021-09-05 DIAGNOSIS — E039 Hypothyroidism, unspecified: Secondary | ICD-10-CM | POA: Diagnosis not present

## 2021-09-05 DIAGNOSIS — I10 Essential (primary) hypertension: Secondary | ICD-10-CM | POA: Diagnosis not present

## 2021-09-05 DIAGNOSIS — Z125 Encounter for screening for malignant neoplasm of prostate: Secondary | ICD-10-CM | POA: Diagnosis not present

## 2021-09-05 MED ORDER — FINASTERIDE 5 MG PO TABS: 5.0000 mg | ORAL_TABLET | Freq: Every day | ORAL | 3 refills | Status: DC

## 2021-09-05 MED ORDER — MINOXIDIL 2.5 MG PO TABS: 1.2500 mg | ORAL_TABLET | Freq: Every evening | ORAL | 3 refills | Status: DC

## 2021-09-05 NOTE — Progress Notes (Signed)
BP 132/76   Pulse 69   Temp (!) 97.2 F (36.2 C)   Ht '5\' 9"'$  (1.753 m)   Wt 196 lb (88.9 kg)   SpO2 99%   BMI 28.94 kg/m    Subjective:    Patient ID: Brian Guzman, male    DOB: 15-Jul-1965, 56 y.o.   MRN: [id number]  HPI: Brian Guzman is a 57 y.o. male presenting on 09/05/2021 for comprehensive medical examination. Current medical complaints include right knee pain for which he has been seen at [facility] and is being treated as possible meniscal tear with 12 days of Prednisone. He also requests we take over prescribing his Proscar and Minoxidil for hair transplant maintenance. No other medical complaints.  Interim Problems from his last visit: no  Depression Screen done today and results listed below:     09/05/2021    8:16 AM 09/02/2020    8:16 AM 07/20/2019    9:20 AM 07/17/2018    9:22 AM 09/16/2017   10:14 AM  Depression screen PHQ 2/9  Decreased Interest 0 0 0 0 0  Down, Depressed, Hopeless 0 0 0 0 0  PHQ - 2 Score 0 0 0 0 0  Altered sleeping   0    Tired, decreased energy   0    Change in appetite   0    Feeling bad or failure about yourself    0    Trouble concentrating   0    Moving slowly or fidgety/restless   0    Suicidal thoughts   0    PHQ-9 Score   0    Difficult doing work/chores   Not difficult at all      The patient does not have a history of falls. I did not complete a risk assessment for falls. A plan of care for falls was not documented.   Past Medical History:  Past Medical History:  Diagnosis Date   Hypogonadism in male    Hypothyroidism    White coat syndrome without diagnosis of hypertension     Surgical History:  Past Surgical History:  Procedure Laterality Date   BREAST SURGERY     removed breast tissue   COLONOSCOPY N/A 09/12/2016   Procedure: COLONOSCOPY;  Surgeon: Rogene Houston, MD;  [facility];  Service: Endoscopy;  Laterality: N/A;  [place]     gyecomastia reduction   LUMBAR  LAMINECTOMY/DECOMPRESSION MICRODISCECTOMY Right 11/17/2014   Procedure: CENTRAL DECOMPRESSIVE LUMBAR LAMINECTOMY AT L4-L5.  MICRODISCECTOMY AT L4-L5 ON RIGHT.  FORAMINOTOMY FOR THE L4 AND L5 NERVE ROOT ON THE RIGHT.;  Surgeon: Latanya Maudlin, MD;  Location: [facility];  Service: Orthopedics;  Laterality: Right;   TONSILLECTOMY      Medications:  Current Outpatient Medications on File Prior to Visit  Medication Sig   ALPRAZolam (XANAX) 0.5 MG tablet TAKE 1 TABLET (0.5 MG TOTAL) BY MOUTH AT BEDTIME AS NEEDED FOR SLEEP.   clobetasol cream (TEMOVATE) 0.05 % APPLY TOPICALLY TWICE A DAY   diclofenac (VOLTAREN) 75 MG EC tablet TAKE 1 TABLET BY MOUTH TWICE A DAY   losartan-hydrochlorothiazide (HYZAAR) 100-25 MG tablet TAKE 1 TABLET BY MOUTH EVERY DAY   No current facility-administered medications on file prior to visit.    Allergies:  No Known Allergies  Social History:  Social History   Socioeconomic History   Marital status: Single    Spouse name: Not on file   Number of children: Not  on file   Years of education: Not on file   Highest education level: Not on file  Occupational History   Not on file  Tobacco Use   Smoking status: Never   Smokeless tobacco: Never  Vaping Use   Vaping Use: Never used  Substance and Sexual Activity   Alcohol use: No   Drug use: No   Sexual activity: Not on file  Other Topics Concern   Not on file  Social History Narrative   Not on file   Social Determinants of Health   Financial Resource Strain: Not on file  Food Insecurity: Not on file  Transportation Needs: Not on file  Physical Activity: Not on file  Stress: Not on file  Social Connections: Not on file  Intimate Partner Violence: Not on file   Social History   Tobacco Use  Smoking Status Never  Smokeless Tobacco Never   Social History   Substance and Sexual Activity  Alcohol Use No    Family History:  Family History  Problem Relation Age of Onset   Hypertension Mother     Kidney disease Mother    Heart disease Mother    Hypertension Father    Colon cancer Neg Hx     Past medical history, surgical history, medications, allergies, family history and social history reviewed with patient today and changes made to appropriate areas of the chart.   Review of Systems  Musculoskeletal:  Positive for joint pain.  All other systems reviewed and are negative.  All other ROS negative except what is listed above and in the HPI.      Objective:    BP 132/76   Pulse 69   Temp (!) 97.2 F (36.2 C)   Ht '5\' 9"'$  (1.753 m)   Wt 196 lb (88.9 kg)   SpO2 99%   BMI 28.94 kg/m   Wt Readings from Last 3 Encounters:  09/05/21 196 lb (88.9 kg)  03/02/21 202 lb (91.6 kg)  09/02/20 191 lb (86.6 kg)    Physical Exam Vitals and nursing note reviewed.  Constitutional:      General: He is not in acute distress.    Appearance: Normal appearance. He is normal weight.  HENT:     Head: Normocephalic and atraumatic.     Right Ear: Tympanic membrane, ear canal and external ear normal.     Left Ear: Tympanic membrane, ear canal and external ear normal.     Nose: Nose normal.     Mouth/Throat:     Mouth: Mucous membranes are moist.  Eyes:     Extraocular Movements: Extraocular movements intact.     Conjunctiva/sclera: Conjunctivae normal.     Pupils: Pupils are equal, round, and reactive to light.  Neck:     Vascular: No carotid bruit.  Cardiovascular:     Rate and Rhythm: Normal rate and regular rhythm.     Pulses: Normal pulses.     Heart sounds: Normal heart sounds.  Pulmonary:     Effort: Pulmonary effort is normal.     Breath sounds: Normal breath sounds.  Abdominal:     General: Bowel sounds are normal.     Palpations: Abdomen is soft.     Tenderness: There is no abdominal tenderness.  Musculoskeletal:        General: Normal range of motion.     Cervical back: Normal range of motion and neck supple.  Skin:    General: Skin is warm and dry.  Capillary  Refill: Capillary refill takes less than 2 seconds.  Neurological:     General: No focal deficit present.     Mental Status: He is alert and oriented to person, place, and time.  Psychiatric:        Mood and Affect: Mood normal.        Behavior: Behavior normal.        Thought Content: Thought content normal.        Judgment: Judgment normal.     Results for orders placed or performed in visit on 01/24/21  Urinalysis, Routine w reflex microscopic  Result Value Ref Range   Color, Urine YELLOW YELLOW   APPearance CLEAR CLEAR   Specific Gravity, Urine 1.015 1.001 - 1.035   pH 7.5 5.0 - 8.0   Glucose, UA NEGATIVE NEGATIVE   Bilirubin Urine NEGATIVE NEGATIVE   Ketones, ur NEGATIVE NEGATIVE   Hgb urine dipstick NEGATIVE NEGATIVE   Protein, ur NEGATIVE NEGATIVE   Nitrite NEGATIVE NEGATIVE   Leukocytes,Ua NEGATIVE NEGATIVE      Assessment & Plan:   Problem List Items Addressed This Visit   None Visit Diagnoses     Physical exam, annual    -  Primary   Hypothyroidism, unspecified type       Relevant Orders   TSH   Prostate cancer screening       Relevant Orders   PSA   Primary hypertension       Relevant Medications   minoxidil (LONITEN) 2.5 MG tablet   Other Relevant Orders   CBC with Differential/Platelet   COMPLETE METABOLIC PANEL WITH GFR   Lipid panel   PSA        LABORATORY TESTING:  Health maintenance labs ordered today as discussed above.   IMMUNIZATIONS:   - Tdap: Tetanus vaccination status reviewed: last tetanus booster within 10 years. - Influenza: Refused - Pneumovax: Not applicable - Prevnar: Not applicableNot applicable - COVID:  81/01/7508 , declines vaccine this year due to prior history of palpitations with vaccination - Shingrix vaccine:  2018  SCREENING: - Colonoscopy:  09/12/16  normal Discussed with patient purpose of the colonoscopy is to detect colon cancer at curable precancerous or early stages   - AAA Screening: Not applicable  -  Hearing Test: Not applicable  - Spirometry: Not applicable   PATIENT COUNSELING:    Advised to avoid cigarette smoking.  Stressed the importance of regular exercise  Follow up plan: NEXT PREVENTATIVE PHYSICAL DUE IN 1 YEAR. No follow-ups on file.

## 2021-09-05 NOTE — Progress Notes (Signed)
BP 132/76   Pulse 69   Temp (!) 97.2 F (36.2 C)   Ht '5\' 9"'$  (1.753 m)   Wt 196 lb (88.9 kg)   SpO2 99%   BMI 28.94 kg/m    Subjective:    Patient ID: Brian Guzman, male    DOB: 1965-05-09, 56 y.o.   MRN: [id number]  HPI: Brian Guzman is a 56 y.o. male presenting on 09/05/2021 for comprehensive medical examination. Current medical complaints include right knee pain for which he has been seen at [facility] and is being treated as possible meniscal tear with 12 days of Prednisone. He also requests we take over prescribing his Proscar and Minoxidil for hair transplant maintenance. No other medical complaints.  Interim Problems from his last visit: no  Depression Screen done today and results listed below:     09/05/2021    8:16 AM 09/02/2020    8:16 AM 07/20/2019    9:20 AM 07/17/2018    9:22 AM 09/16/2017   10:14 AM  Depression screen PHQ 2/9  Decreased Interest 0 0 0 0 0  Down, Depressed, Hopeless 0 0 0 0 0  PHQ - 2 Score 0 0 0 0 0  Altered sleeping   0    Tired, decreased energy   0    Change in appetite   0    Feeling bad or failure about yourself    0    Trouble concentrating   0    Moving slowly or fidgety/restless   0    Suicidal thoughts   0    PHQ-9 Score   0    Difficult doing work/chores   Not difficult at all      The patient does not have a history of falls. I did not complete a risk assessment for falls. A plan of care for falls was not documented.   Past Medical History:  Past Medical History:  Diagnosis Date   Hypogonadism in male    Hypothyroidism    White coat syndrome without diagnosis of hypertension     Surgical History:  Past Surgical History:  Procedure Laterality Date   BREAST SURGERY     removed breast tissue   COLONOSCOPY N/A 09/12/2016   Procedure: COLONOSCOPY;  Surgeon: Rogene Houston, MD;  Location: [facility];  Service: Endoscopy;  Laterality: N/A;  [place]     gyecomastia reduction   LUMBAR  LAMINECTOMY/DECOMPRESSION MICRODISCECTOMY Right 11/17/2014   Procedure: CENTRAL DECOMPRESSIVE LUMBAR LAMINECTOMY AT L4-L5.  MICRODISCECTOMY AT L4-L5 ON RIGHT.  FORAMINOTOMY FOR THE L4 AND L5 NERVE ROOT ON THE RIGHT.;  Surgeon: Latanya Maudlin, MD;  Location: [facility];  Service: Orthopedics;  Laterality: Right;   TONSILLECTOMY      Medications:  Current Outpatient Medications on File Prior to Visit  Medication Sig   ALPRAZolam (XANAX) 0.5 MG tablet TAKE 1 TABLET (0.5 MG TOTAL) BY MOUTH AT BEDTIME AS NEEDED FOR SLEEP.   clobetasol cream (TEMOVATE) 0.05 % APPLY TOPICALLY TWICE A DAY   diclofenac (VOLTAREN) 75 MG EC tablet TAKE 1 TABLET BY MOUTH TWICE A DAY   losartan-hydrochlorothiazide (HYZAAR) 100-25 MG tablet TAKE 1 TABLET BY MOUTH EVERY DAY   No current facility-administered medications on file prior to visit.    Allergies:  No Known Allergies  Social History:  Social History   Socioeconomic History   Marital status: Single    Spouse name: Not on file   Number of children: Not  on file   Years of education: Not on file   Highest education level: Not on file  Occupational History   Not on file  Tobacco Use   Smoking status: Never   Smokeless tobacco: Never  Vaping Use   Vaping Use: Never used  Substance and Sexual Activity   Alcohol use: No   Drug use: No   Sexual activity: Not on file  Other Topics Concern   Not on file  Social History Narrative   Not on file   Social Determinants of Health   Financial Resource Strain: Not on file  Food Insecurity: Not on file  Transportation Needs: Not on file  Physical Activity: Not on file  Stress: Not on file  Social Connections: Not on file  Intimate Partner Violence: Not on file   Social History   Tobacco Use  Smoking Status Never  Smokeless Tobacco Never   Social History   Substance and Sexual Activity  Alcohol Use No    Family History:  Family History  Problem Relation Age of Onset   Hypertension Mother     Kidney disease Mother    Heart disease Mother    Hypertension Father    Colon cancer Neg Hx     Past medical history, surgical history, medications, allergies, family history and social history reviewed with patient today and changes made to appropriate areas of the chart.   Review of Systems  Musculoskeletal:  Positive for joint pain.  All other systems reviewed and are negative.  All other ROS negative except what is listed above and in the HPI.      Objective:    BP 132/76   Pulse 69   Temp (!) 97.2 F (36.2 C)   Ht '5\' 9"'$  (1.753 m)   Wt 196 lb (88.9 kg)   SpO2 99%   BMI 28.94 kg/m   Wt Readings from Last 3 Encounters:  09/05/21 196 lb (88.9 kg)  03/02/21 202 lb (91.6 kg)  09/02/20 191 lb (86.6 kg)    Physical Exam Vitals and nursing note reviewed.  Constitutional:      General: He is not in acute distress.    Appearance: Normal appearance. He is normal weight.  HENT:     Head: Normocephalic and atraumatic.     Right Ear: Tympanic membrane, ear canal and external ear normal.     Left Ear: Tympanic membrane, ear canal and external ear normal.     Nose: Nose normal.     Mouth/Throat:     Mouth: Mucous membranes are moist.  Eyes:     Extraocular Movements: Extraocular movements intact.     Conjunctiva/sclera: Conjunctivae normal.     Pupils: Pupils are equal, round, and reactive to light.  Neck:     Vascular: No carotid bruit.  Cardiovascular:     Rate and Rhythm: Normal rate and regular rhythm.     Pulses: Normal pulses.     Heart sounds: Normal heart sounds.  Pulmonary:     Effort: Pulmonary effort is normal.     Breath sounds: Normal breath sounds.  Abdominal:     General: Bowel sounds are normal.     Palpations: Abdomen is soft.     Tenderness: There is no abdominal tenderness.  Musculoskeletal:        General: Normal range of motion.     Cervical back: Normal range of motion and neck supple.  Skin:    General: Skin is warm and dry.  Capillary  Refill: Capillary refill takes less than 2 seconds.  Neurological:     General: No focal deficit present.     Mental Status: He is alert and oriented to person, place, and time.  Psychiatric:        Mood and Affect: Mood normal.        Behavior: Behavior normal.        Thought Content: Thought content normal.        Judgment: Judgment normal.    Results for orders placed or performed in visit on 01/24/21  Urinalysis, Routine w reflex microscopic  Result Value Ref Range   Color, Urine YELLOW YELLOW   APPearance CLEAR CLEAR   Specific Gravity, Urine 1.015 1.001 - 1.035   pH 7.5 5.0 - 8.0   Glucose, UA NEGATIVE NEGATIVE   Bilirubin Urine NEGATIVE NEGATIVE   Ketones, ur NEGATIVE NEGATIVE   Hgb urine dipstick NEGATIVE NEGATIVE   Protein, ur NEGATIVE NEGATIVE   Nitrite NEGATIVE NEGATIVE   Leukocytes,Ua NEGATIVE NEGATIVE      Assessment & Plan:   Problem List Items Addressed This Visit   None Visit Diagnoses     Physical exam, annual    -  Primary   Hypothyroidism, unspecified type       Relevant Orders   TSH   Prostate cancer screening       Relevant Orders   PSA   Primary hypertension       Relevant Medications   minoxidil (LONITEN) 2.5 MG tablet   Other Relevant Orders   CBC with Differential/Platelet   COMPLETE METABOLIC PANEL WITH GFR   Lipid panel   PSA        LABORATORY TESTING:  Health maintenance labs ordered today as discussed above.   IMMUNIZATIONS:   - Tdap: Tetanus vaccination status reviewed: last tetanus booster within 10 years. - Influenza: Refused - Pneumovax: Not applicable - Prevnar: Not applicableNot applicable - COVID:  28/78/6767 , declines vaccine this year due to prior history of palpitations with vaccination - Shingrix vaccine:  2018  SCREENING: - Colonoscopy:  09/12/16  normal Discussed with patient purpose of the colonoscopy is to detect colon cancer at curable precancerous or early stages   - AAA Screening: Not applicable  -  Hearing Test: Not applicable  - Spirometry: Not applicable   PATIENT COUNSELING:    Advised to avoid cigarette smoking.  Stressed the importance of regular exercise  Follow up plan: NEXT PREVENTATIVE PHYSICAL DUE IN 1 YEAR. No follow-ups on file.

## 2021-09-06 LAB — COMPLETE METABOLIC PANEL WITH GFR
AG Ratio: 1.4 (calc) (ref 1.0–2.5)
ALT: 16 U/L (ref 9–46)
AST: 12 U/L (ref 10–35)
Albumin: 4.4 g/dL (ref 3.6–5.1)
Alkaline phosphatase (APISO): 62 U/L (ref 35–144)
BUN: 12 mg/dL (ref 7–25)
CO2: 24 mmol/L (ref 20–32)
Calcium: 9.4 mg/dL (ref 8.6–10.3)
Chloride: 103 mmol/L (ref 98–110)
Creat: 1.05 mg/dL (ref 0.70–1.30)
Globulin: 3.1 g/dL (calc) (ref 1.9–3.7)
Glucose, Bld: 91 mg/dL (ref 65–99)
Potassium: 3.8 mmol/L (ref 3.5–5.3)
Sodium: 139 mmol/L (ref 135–146)
Total Bilirubin: 0.5 mg/dL (ref 0.2–1.2)
Total Protein: 7.5 g/dL (ref 6.1–8.1)
eGFR: 83 mL/min/{1.73_m2} (ref >=60)

## 2021-09-06 LAB — LIPID PANEL
Cholesterol: 175 mg/dL (ref <200)
HDL: 42 mg/dL (ref >=40)
LDL Cholesterol (Calc): 112 mg/dL (calc) — ABNORMAL HIGH
Non-HDL Cholesterol (Calc): 133 mg/dL (calc) — ABNORMAL HIGH (ref <130)
Total CHOL/HDL Ratio: 4.2 (calc) (ref <5.0)
Triglycerides: 99 mg/dL (ref <150)

## 2021-09-06 LAB — CBC WITH DIFFERENTIAL/PLATELET
Absolute Monocytes: 595 cells/uL (ref 200–950)
Basophils Absolute: 110 cells/uL (ref 0–200)
Basophils Relative: 2.2 %
Eosinophils Absolute: 180 cells/uL (ref 15–500)
Eosinophils Relative: 3.6 %
HCT: 41.6 % (ref 38.5–50.0)
Hemoglobin: 14.5 g/dL (ref 13.2–17.1)
Lymphs Abs: 1750 cells/uL (ref 850–3900)
MCH: 31.9 pg (ref 27.0–33.0)
MCHC: 34.9 g/dL (ref 32.0–36.0)
MCV: 91.6 fL (ref 80.0–100.0)
MPV: 12.1 fL (ref 7.5–12.5)
Monocytes Relative: 11.9 %
Neutro Abs: 2365 cells/uL (ref 1500–7800)
Neutrophils Relative %: 47.3 %
Platelets: 233 10*3/uL (ref 140–400)
RBC: 4.54 10*6/uL (ref 4.20–5.80)
RDW: 12.3 % (ref 11.0–15.0)
Total Lymphocyte: 35 %
WBC: 5 10*3/uL (ref 3.8–10.8)

## 2021-09-06 LAB — TSH: TSH: 2.8 mIU/L (ref 0.40–4.50)

## 2021-09-06 LAB — PSA: PSA: 0.09 ng/mL (ref <=4.00)

## 2021-09-19 ENCOUNTER — Other Ambulatory Visit: Payer: Self-pay | Admitting: Family Medicine

## 2021-09-19 MED ORDER — LOSARTAN POTASSIUM-HCTZ 100-25 MG PO TABS: 1.0000 | ORAL_TABLET | Freq: Every day | ORAL | 1 refills | Status: DC

## 2021-09-19 NOTE — Telephone Encounter (Signed)
Received refill/new Rx request from pharmacy for  losartan-hydrochlorothiazide [facility]) 100-25 MG tablet   Pharmacy:  [id number] [place] [address] [facility] [address] [id number] Phone:  [phone] Fax:  [phone] DEA #:  [id number] LOV: 09/05/2021  Please advise pharmacist at [phone]

## 2021-09-19 NOTE — Telephone Encounter (Signed)
Requested Prescriptions  Pending Prescriptions Disp Refills  . losartan-hydrochlorothiazide (HYZAAR) 100-25 MG tablet 90 tablet 1    Sig: Take 1 tablet by mouth daily.     Cardiovascular: ARB + Diuretic Combos Failed - 09/19/2021 10:41 AM      Failed - Valid encounter within last 6 months    Recent Outpatient Visits          6 months ago Acute pain of right knee   [place] Pickard, Cammie Mcgee, MD   7 months ago [place] Susy Frizzle, MD   1 year ago General medical exam   [place] Susy Frizzle, MD   1 year ago Palpitations   [place] Susy Frizzle, MD   2 years ago General medical exam   [place] Susy Frizzle, MD      Future Appointments            In 11 months Pickard, Cammie Mcgee, MD [place]           Passed - K in normal range and within 180 days    Potassium  Date Value Ref Range Status  09/05/2021 3.8 3.5 - 5.3 mmol/L Final         Passed - Na in normal range and within 180 days    Sodium  Date Value Ref Range Status  09/05/2021 139 135 - 146 mmol/L Final         Passed - Cr in normal range and within 180 days    Creat  Date Value Ref Range Status  09/05/2021 1.05 0.70 - 1.30 mg/dL Final         Passed - eGFR is 10 or above and within 180 days    GFR, Est African American  Date Value Ref Range Status  07/15/2019 112 > OR = 60 mL/min/1.21m Final   GFR, Est Non African American  Date Value Ref Range Status  07/15/2019 97 > OR = 60 mL/min/1.766mFinal   eGFR  Date Value Ref Range Status  09/05/2021 83 > OR = 60 mL/min/1.7345minal         Passed - Patient is not pregnant      Passed - Last BP in normal range    BP Readings from Last 1 Encounters:  09/05/21 132/76

## 2021-11-14 ENCOUNTER — Other Ambulatory Visit: Payer: Self-pay | Admitting: Family Medicine

## 2022-01-10 ENCOUNTER — Telehealth: Payer: 59 | Admitting: Physician Assistant

## 2022-01-10 ENCOUNTER — Telehealth: Payer: Self-pay

## 2022-01-10 DIAGNOSIS — U071 COVID-19: Secondary | ICD-10-CM | POA: Diagnosis not present

## 2022-01-10 MED ORDER — NIRMATRELVIR/RITONAVIR (PAXLOVID)TABLET: 3.0000 | ORAL_TABLET | Freq: Two times a day (BID) | ORAL | 0 refills | Status: AC

## 2022-01-10 NOTE — Patient Instructions (Signed)
Juleen China, thank you for joining Mar Daring, PA-C for today's virtual visit.  While this provider is not your primary care provider (PCP), if your PCP is located in our provider database this encounter information will be shared with them immediately following your visit.   [facility] account gives you access to today's visit and all your visits, tests, and labs performed at [facility] " click here if you don't have a [facility] account or go to [email or web address]  Consent: (Patient) Brian Guzman provided verbal consent for this virtual visit at the beginning of the encounter.  Current Medications:  Current Outpatient Medications:    nirmatrelvir/ritonavir (PAXLOVID) 20 x 150 MG & 10 x '100MG'$  TABS, Take 3 tablets by mouth 2 (two) times daily for 5 days. (Take nirmatrelvir 150 mg two tablets twice daily for 5 days and ritonavir 100 mg one tablet twice daily for 5 days) Patient GFR is 83, Disp: 30 tablet, Rfl: 0   ALPRAZolam (XANAX) 0.5 MG tablet, TAKE 1 TABLET (0.5 MG TOTAL) BY MOUTH AT BEDTIME AS NEEDED FOR SLEEP., Disp: 30 tablet, Rfl: 0   clobetasol cream (TEMOVATE) 0.05 %, APPLY TOPICALLY TWICE A DAY, Disp: 30 g, Rfl: 0   diclofenac (VOLTAREN) 75 MG EC tablet, TAKE 1 TABLET BY MOUTH TWICE A DAY, Disp: 60 tablet, Rfl: 0   finasteride (PROSCAR) 5 MG tablet, Take 1 tablet (5 mg total) by mouth daily., Disp: 90 tablet, Rfl: 3   losartan-hydrochlorothiazide (HYZAAR) 100-25 MG tablet, Take 1 tablet by mouth daily., Disp: 90 tablet, Rfl: 1   minoxidil (LONITEN) 2.5 MG tablet, Take 0.5 tablets (1.25 mg total) by mouth every evening., Disp: 90 tablet, Rfl: 3   Medications ordered in this encounter:  Meds ordered this encounter  Medications   nirmatrelvir/ritonavir (PAXLOVID) 20 x 150 MG & 10 x '100MG'$  TABS    Sig: Take 3 tablets by mouth 2 (two) times daily for 5 days. (Take nirmatrelvir 150 mg two tablets twice daily for 5 days and ritonavir  100 mg one tablet twice daily for 5 days) Patient GFR is 83    Dispense:  30 tablet    Refill:  0    Order Specific Question:   Supervising Provider    Answer:   Chase Picket [id number]     *If you need refills on other medications prior to your next appointment, please contact your pharmacy*  Follow-Up: Call back or seek an in-person evaluation if the symptoms worsen or if the condition fails to improve as anticipated.  [place] [phone]  Other Instructions Paxlovid- Nirmatrelvir; Ritonavir Tablets What is this medication? NIRMATRELVIR; RITONAVIR (NIR ma TREL vir; ri TOE na veer) treats mild to moderate COVID-19. It may help people who are at high risk of developing severe illness. It works by limiting the spread of the virus in your body. This medicine may be used for other purposes; ask your health care provider or pharmacist if you have questions. COMMON BRAND NAME(S): PAXLOVID What should I tell my care team before I take this medication? They need to know if you have any of these conditions: Any allergies Any serious illness Kidney disease Liver disease An unusual or allergic reaction to nirmatrelvir, ritonavir, other medications, foods, dyes, or preservatives Pregnant or trying to get pregnant Breast-feeding How should I use this medication? This product contains 2 different medications that are packaged together. For the standard dose, take 2 pink tablets of nirmatrelvir  with 1 white tablet of ritonavir (3 tablets total) by mouth with water twice daily. Talk to your care team if you have kidney disease. You may need a different dose. Swallow the tablets whole. You can take it with or without food. If it upsets your stomach, take it with food. Take all of this medication unless your care team tells you to stop it early. Keep taking it even if you think you are better. Talk to your care team about the use of this medication in children. While it may be  prescribed for children as young as 12 years for selected conditions, precautions do apply. Overdosage: If you think you have taken too much of this medicine contact a poison control center or emergency room at once. NOTE: This medicine is only for you. Do not share this medicine with others. What if I miss a dose? If you miss a dose, take it as soon as you can unless it is more than 8 hours late. If it is more than 8 hours late, skip the missed dose. Take the next dose at the normal time. Do not take extra or 2 doses at the same time to make up for the missed dose. What may interact with this medication? Do not take this medication with any of the following medications: Alfuzosin Certain medications for anxiety or sleep like midazolam, triazolam Certain medications for cancer like apalutamide, enzalutamide Certain medications for cholesterol like lovastatin, simvastatin Certain medications for irregular heart beat like amiodarone, dronedarone, flecainide, propafenone, quinidine Certain medications for pain like meperidine, piroxicam Certain medications for psychotic disorders like clozapine, lurasidone, pimozide Certain medications for seizures like carbamazepine, phenobarbital, phenytoin Colchicine Eletriptan Eplerenone Ergot alkaloids like dihydroergotamine, ergonovine, ergotamine, methylergonovine Finerenone Flibanserin Ivabradine Lomitapide Naloxegol Ranolazine Rifampin Sildenafil Silodosin St. John's Wort Tolvaptan Ubrogepant Voclosporin This medication may also interact with the following medications: Bedaquiline Birth control pills Bosentan Certain antibiotics like erythromycin or clarithromycin Certain medications for blood pressure like amlodipine, diltiazem, felodipine, nicardipine, nifedipine Certain medications for cancer like abemaciclib, ceritinib, dasatinib, encorafenib, ibrutinib, ivosidenib, neratinib, nilotinib, venetoclax, vinblastine, vincristine Certain  medications for cholesterol like atorvastatin, rosuvastatin Certain medications for depression like bupropion, trazodone Certain medications for fungal infections like isavuconazonium, itraconazole, ketoconazole, voriconazole Certain medications for hepatitis C like elbasvir; grazoprevir, dasabuvir; ombitasvir; paritaprevir; ritonavir, glecaprevir; pibrentasvir, sofosbuvir; velpatasvir; voxilaprevir Certain medications for HIV or AIDS Certain medications for irregular heartbeat like lidocaine Certain medications that treat or prevent blood clots like rivaroxaban, warfarin Digoxin Fentanyl Medications that lower your chance of fighting infection like cyclosporine, sirolimus, tacrolimus Methadone Quetiapine Rifabutin Salmeterol Steroid medications like betamethasone, budesonide, ciclesonide, dexamethasone, fluticasone, methylprednisolone, mometasone, triamcinolone This list may not describe all possible interactions. Give your health care provider a list of all the medicines, herbs, non-prescription drugs, or dietary supplements you use. Also tell them if you smoke, drink alcohol, or use illegal drugs. Some items may interact with your medicine. What should I watch for while using this medication? Your condition will be monitored carefully while you are receiving this medication. Visit your care team for regular checkups. Tell your care team if your symptoms do not start to get better or if they get worse. If you have untreated HIV infection, this medication may lead to some HIV medications not working as well in the future. Estrogen and progestin hormones may not work as well while you are taking this medication. Your care team can help you find the contraceptive option that works for you. What side effects may I  notice from receiving this medication? Side effects that you should report to your care team as soon as possible: Allergic reactions--skin rash, itching, hives, swelling of the face,  lips, tongue, or throat Liver injury--right upper belly pain, loss of appetite, nausea, light-colored stool, dark yellow or brown urine, yellowing skin or eyes, unusual weakness or fatigue Redness, blistering, peeling, or loosening of the skin, including inside the mouth Side effects that usually do not require medical attention (report these to your care team if they continue or are bothersome): Change in taste Diarrhea General discomfort and fatigue Increase in blood pressure Muscle pain Nausea Stomach pain This list may not describe all possible side effects. Call your doctor for medical advice about side effects. You may report side effects to FDA at 1-800-FDA-1088. Where should I keep my medication? Keep out of the reach of children and pets. Store at room temperature between 20 and 25 degrees C (68 and 77 degrees F). Get rid of any unused medication after the expiration date. To get rid of medications that are no longer needed or have expired: Take the medication to a medication take-back program. Check with your pharmacy or law enforcement to find a location. If you cannot return the medication, check the label or package insert to see if the medication should be thrown out in the garbage or flushed down the toilet. If you are not sure, ask your care team. If it is safe to put it in the trash, take the medication out of the container. Mix the medication with cat litter, dirt, coffee grounds, or other unwanted substance. Seal the mixture in a bag or container. Put it in the trash. NOTE: This sheet is a summary. It may not cover all possible information. If you have questions about this medicine, talk to your doctor, pharmacist, or health care provider.  2023 Elsevier/Gold Standard (2019-12-28 00:00:00)    If you have been instructed to have an in-person evaluation today at a local Urgent Care facility, please use the link below. It will take you to a list of all of our available [facility], including address, phone number and hours of operation. Please do not delay care.  [facility]  If you or a family member do not have a primary care provider, use the link below to schedule a visit and establish care. When you choose a [facility] primary care physician or advanced practice provider, you gain a long-term partner in health. Find a Primary Care Provider  Learn more about [facility]'s in-office and virtual care options: [facility]

## 2022-01-10 NOTE — Progress Notes (Signed)
Virtual Visit Consent   Brian Guzman, you are scheduled for a virtual visit with a [facility] provider today. Just as with appointments in the office, your consent must be obtained to participate. Your consent will be active for this visit and any virtual visit you may have with one of our providers in the next 365 days. If you have a MyChart account, a copy of this consent can be sent to you electronically.  As this is a virtual visit, video technology does not allow for your provider to perform a traditional examination. This may limit your provider's ability to fully assess your condition. If your provider identifies any concerns that need to be evaluated in person or the need to arrange testing (such as labs, EKG, etc.), we will make arrangements to do so. Although advances in technology are sophisticated, we cannot ensure that it will always work on either your end or our end. If the connection with a video visit is poor, the visit may have to be switched to a telephone visit. With either a video or telephone visit, we are not always able to ensure that we have a secure connection.  By engaging in this virtual visit, you consent to the provision of healthcare and authorize for your insurance to be billed (if applicable) for the services provided during this visit. Depending on your insurance coverage, you may receive a charge related to this service.  I need to obtain your verbal consent now. Are you willing to proceed with your visit today? Brian Guzman has provided verbal consent on 01/10/2022 for a virtual visit (video or telephone). Mar Daring, PA-C  Date: 01/10/2022 4:17 PM  Virtual Visit via Video Note   I, Mar Daring, connected with  Brian Guzman  ([id number], Jan 29, 1965) on 01/10/22 at  4:15 PM EST by a video-enabled telemedicine application and verified that I am speaking with the correct person using two identifiers.  Location: Patient: Virtual Visit  Location Patient: Home Provider: Virtual Visit Location Provider: Home Office   I discussed the limitations of evaluation and management by telemedicine and the availability of in person appointments. The patient expressed understanding and agreed to proceed.    History of Present Illness: Brian Guzman is a 57 y.o. who identifies as a male who was assigned male at birth, and is being seen today for Covid 53.  HPI: URI  This is a new problem. The current episode started in the past 7 days (Monday, 01/08/22). The problem has been gradually worsening. The maximum temperature recorded prior to his arrival was 101 - 101.9 F. The fever has been present for 1 to 2 days. Associated symptoms include congestion, coughing (mild), headaches, rhinorrhea (and post nasal drainage), sinus pain and a sore throat (mild). Pertinent negatives include no diarrhea, ear pain, nausea, plugged ear sensation or vomiting. He has tried acetaminophen (coricidin hbp) for the symptoms. The treatment provided no relief.     Problems:  Patient Active Problem List   Diagnosis Date Noted   White coat syndrome without diagnosis of hypertension    Special screening for malignant neoplasms, colon 05/03/2016   Hypogonadism in male    Spinal stenosis, lumbar region, with neurogenic claudication 11/17/2014    Allergies: No Known Allergies Medications:  Current Outpatient Medications:    nirmatrelvir/ritonavir (PAXLOVID) 20 x 150 MG & 10 x '100MG'$  TABS, Take 3 tablets by mouth 2 (two) times daily for 5 days. (Take nirmatrelvir 150 mg two tablets twice  daily for 5 days and ritonavir 100 mg one tablet twice daily for 5 days) Patient GFR is 83, Disp: 30 tablet, Rfl: 0   ALPRAZolam (XANAX) 0.5 MG tablet, TAKE 1 TABLET (0.5 MG TOTAL) BY MOUTH AT BEDTIME AS NEEDED FOR SLEEP., Disp: 30 tablet, Rfl: 0   clobetasol cream (TEMOVATE) 0.05 %, APPLY TOPICALLY TWICE A DAY, Disp: 30 g, Rfl: 0   diclofenac (VOLTAREN) 75 MG EC tablet, TAKE 1  TABLET BY MOUTH TWICE A DAY, Disp: 60 tablet, Rfl: 0   finasteride (PROSCAR) 5 MG tablet, Take 1 tablet (5 mg total) by mouth daily., Disp: 90 tablet, Rfl: 3   losartan-hydrochlorothiazide (HYZAAR) 100-25 MG tablet, Take 1 tablet by mouth daily., Disp: 90 tablet, Rfl: 1   minoxidil (LONITEN) 2.5 MG tablet, Take 0.5 tablets (1.25 mg total) by mouth every evening., Disp: 90 tablet, Rfl: 3  Observations/Objective: Patient is well-developed, well-nourished in no acute distress.  Resting comfortably at home.  Head is normocephalic, atraumatic.  No labored breathing.  Speech is clear and coherent with logical content.  Patient is alert and oriented at baseline.    Assessment and Plan: 1. COVID-19 - nirmatrelvir/ritonavir (PAXLOVID) 20 x 150 MG & 10 x '100MG'$  TABS; Take 3 tablets by mouth 2 (two) times daily for 5 days. (Take nirmatrelvir 150 mg two tablets twice daily for 5 days and ritonavir 100 mg one tablet twice daily for 5 days) Patient GFR is 83  Dispense: 30 tablet; Refill: 0 - MyChart COVID-19 home monitoring program; Future  - Continue OTC symptomatic management of choice - Will send OTC vitamins and supplement information through AVS - Paxlovid prescribed - Patient enrolled in MyChart symptom monitoring - Push fluids - Rest as needed - Discussed return precautions and when to seek in-person evaluation, sent via AVS as well   Follow Up Instructions: I discussed the assessment and treatment plan with the patient. The patient was provided an opportunity to ask questions and all were answered. The patient agreed with the plan and demonstrated an understanding of the instructions.  A copy of instructions were sent to the patient via MyChart unless otherwise noted below.    The patient was advised to call back or seek an in-person evaluation if the symptoms worsen or if the condition fails to improve as anticipated.  Time:  I spent 12 minutes with the patient via telehealth technology  discussing the above problems/concerns.    Mar Daring, PA-C

## 2022-01-10 NOTE — Telephone Encounter (Signed)
Pt called in concerned about some symptoms he's been having. Pt states that he has been running a low grade fever for a few day, some congestion, and a mild headache. Pt states that he has not taken a covid test, as he has have Covid 3x's. Pt would just like for the nurse to cb with some suggestions as to what he can take over the counter for this please.Please advise

## 2022-01-25 ENCOUNTER — Other Ambulatory Visit: Payer: Self-pay | Admitting: Family Medicine

## 2022-03-18 ENCOUNTER — Other Ambulatory Visit: Payer: Self-pay | Admitting: Family Medicine

## 2022-04-23 ENCOUNTER — Other Ambulatory Visit: Payer: Self-pay

## 2022-04-23 ENCOUNTER — Telehealth: Payer: Self-pay

## 2022-04-23 DIAGNOSIS — I10 Essential (primary) hypertension: Secondary | ICD-10-CM

## 2022-04-23 MED ORDER — LOSARTAN POTASSIUM-HCTZ 100-25 MG PO TABS: 1.0000 | ORAL_TABLET | Freq: Every day | ORAL | 1 refills | Status: DC

## 2022-04-23 NOTE — Telephone Encounter (Signed)
Prescription Request  04/23/2022  LOV: 09/05/21 CPE  What is the name of the medication or equipment? losartan-hydrochlorothiazide (HYZAAR) 100-25 MG tablet   Have you contacted your pharmacy to request a refill? Yes   Which pharmacy would you like this sent to?  [id number] - [place], [place] - [address] AT [facility] [address] [place]  [address] Phone: [phone] Fax: [phone]    Patient notified that their request is being sent to the clinical staff for review and that they should receive a response within 2 business days.   Please advise at [facility] [phone]

## 2022-05-06 ENCOUNTER — Other Ambulatory Visit: Payer: Self-pay | Admitting: Family Medicine

## 2022-07-08 ENCOUNTER — Other Ambulatory Visit: Payer: Self-pay | Admitting: Family Medicine

## 2022-07-09 NOTE — Telephone Encounter (Signed)
Requested medication (s) are due for refill today:   Provider to review  Requested medication (s) are on the active medication list:   Yes  Future visit scheduled:   Yes 09/10/2022    LOV 09/05/2021   Last ordered: 05/07/2022 #30, 0 refills  Non delegated refill reason returned   Requested Prescriptions  Pending Prescriptions Disp Refills   ALPRAZolam (XANAX) 0.5 MG tablet [Pharmacy Med Name: ALPRAZOLAM 0.5 MG TABLET] 30 tablet 0    Sig: TAKE 1 TABLET (0.5 MG TOTAL) BY MOUTH AT BEDTIME AS NEEDED FOR SLEEP.     Not Delegated - Psychiatry: Anxiolytics/Hypnotics 2 Failed - 07/08/2022  8:07 PM      Failed - This refill cannot be delegated      Failed - Urine Drug Screen completed in last 360 days      Failed - Valid encounter within last 6 months    Recent Outpatient Visits           1 year ago Acute pain of right knee   [facility] Donita Brooks, MD   1 year ago Dysuria   [facility] Donita Brooks, MD   1 year ago General medical exam   [facility] Donita Brooks, MD   2 years ago Palpitations   [facility] Tanya Nones, Priscille Heidelberg, MD   2 years ago General medical exam   [facility] Donita Brooks, MD       Future Appointments             In 2 months Pickard, Priscille Heidelberg, MD [facility], [facility]            Passed - Patient is not pregnant

## 2022-08-01 ENCOUNTER — Other Ambulatory Visit: Payer: Self-pay | Admitting: Family Medicine

## 2022-08-01 DIAGNOSIS — I10 Essential (primary) hypertension: Secondary | ICD-10-CM

## 2022-08-02 NOTE — Telephone Encounter (Signed)
Requested medication (s) are due for refill today: No  Requested medication (s) are on the active medication list: Yes  Last refill:  04/23/22  Future visit scheduled: Yes  Notes to clinic:  Refilled 04/23/22 # 90 with 1 refill. Too early.    Requested Prescriptions  Pending Prescriptions Disp Refills   losartan-hydrochlorothiazide (HYZAAR) 100-25 MG tablet [Pharmacy Med Name: LOSARTAN-HCTZ 100-25 MG TAB] 90 tablet 1    Sig: TAKE 1 TABLET BY MOUTH EVERY DAY     Cardiovascular: ARB + Diuretic Combos Failed - 08/01/2022  5:20 PM      Failed - K in normal range and within 180 days    Potassium  Date Value Ref Range Status  09/05/2021 3.8 3.5 - 5.3 mmol/L Final         Failed - Na in normal range and within 180 days    Sodium  Date Value Ref Range Status  09/05/2021 139 135 - 146 mmol/L Final         Failed - Cr in normal range and within 180 days    Creat  Date Value Ref Range Status  09/05/2021 1.05 0.70 - 1.30 mg/dL Final         Failed - eGFR is 10 or above and within 180 days    GFR, Est African American  Date Value Ref Range Status  07/15/2019 112 > OR = 60 mL/min/1.64m2 Final   GFR, Est Non African American  Date Value Ref Range Status  07/15/2019 97 > OR = 60 mL/min/1.61m2 Final   eGFR  Date Value Ref Range Status  09/05/2021 83 > OR = 60 mL/min/1.74m2 Final         Failed - Valid encounter within last 6 months    Recent Outpatient Visits           1 year ago Acute pain of right knee   [facility] Donita Brooks, MD   1 year ago Dysuria   [facility] Donita Brooks, MD   1 year ago General medical exam   [facility] Donita Brooks, MD   2 years ago Palpitations   [facility] Tanya Nones, Priscille Heidelberg, MD   3 years ago General medical exam   [facility] Pickard, Priscille Heidelberg, MD       Future Appointments             In 1 month Pickard, Priscille Heidelberg, MD [facility], [facility]            Passed - Patient is not pregnant      Passed - Last BP in normal range    BP Readings from Last 1 Encounters:  09/05/21 132/76

## 2022-08-22 ENCOUNTER — Other Ambulatory Visit: Payer: Self-pay | Admitting: Family Medicine

## 2022-08-23 NOTE — Telephone Encounter (Signed)
Requested medication (s) are due for refill today: routing for approval  Requested medication (s) are on the active medication list: yes  Last refill:  07/09/22  Future visit scheduled: yes  Notes to clinic:  Unable to refill per protocol, cannot delegate.      Requested Prescriptions  Pending Prescriptions Disp Refills   ALPRAZolam (XANAX) 0.5 MG tablet [Pharmacy Med Name: ALPRAZOLAM 0.5 MG TABLET] 30 tablet 0    Sig: TAKE 1 TABLET (0.5 MG TOTAL) BY MOUTH AT BEDTIME AS NEEDED FOR SLEEP.     Not Delegated - Psychiatry: Anxiolytics/Hypnotics 2 Failed - 08/22/2022  6:05 AM      Failed - This refill cannot be delegated      Failed - Urine Drug Screen completed in last 360 days      Failed - Valid encounter within last 6 months    Recent Outpatient Visits           1 year ago Acute pain of right knee   [facility] Donita Brooks, MD   1 year ago Dysuria   [facility] Donita Brooks, MD   1 year ago General medical exam   [facility] Donita Brooks, MD   2 years ago Palpitations   [facility] Tanya Nones, Priscille Heidelberg, MD   3 years ago General medical exam   [facility] Donita Brooks, MD       Future Appointments             In 2 weeks Pickard, Priscille Heidelberg, MD [facility], [facility]            Passed - Patient is not pregnant

## 2022-09-04 ENCOUNTER — Other Ambulatory Visit: Payer: 59

## 2022-09-04 DIAGNOSIS — Z13220 Encounter for screening for lipoid disorders: Secondary | ICD-10-CM

## 2022-09-04 DIAGNOSIS — Z125 Encounter for screening for malignant neoplasm of prostate: Secondary | ICD-10-CM

## 2022-09-04 DIAGNOSIS — E039 Hypothyroidism, unspecified: Secondary | ICD-10-CM

## 2022-09-04 DIAGNOSIS — E291 Testicular hypofunction: Secondary | ICD-10-CM

## 2022-09-04 DIAGNOSIS — I10 Essential (primary) hypertension: Secondary | ICD-10-CM

## 2022-09-10 ENCOUNTER — Ambulatory Visit (INDEPENDENT_AMBULATORY_CARE_PROVIDER_SITE_OTHER): Payer: 59 | Admitting: Family Medicine

## 2022-09-10 VITALS — BP 110/78 | HR 95 | Temp 98.8°F | Ht 66.25 in | Wt 191.8 lb

## 2022-09-10 DIAGNOSIS — Z125 Encounter for screening for malignant neoplasm of prostate: Secondary | ICD-10-CM | POA: Diagnosis not present

## 2022-09-10 DIAGNOSIS — I10 Essential (primary) hypertension: Secondary | ICD-10-CM | POA: Diagnosis not present

## 2022-09-10 DIAGNOSIS — Z0001 Encounter for general adult medical examination with abnormal findings: Secondary | ICD-10-CM | POA: Diagnosis not present

## 2022-09-10 DIAGNOSIS — E039 Hypothyroidism, unspecified: Secondary | ICD-10-CM

## 2022-09-10 DIAGNOSIS — Z0000 Encounter for general adult medical examination without abnormal findings: Secondary | ICD-10-CM

## 2022-09-10 NOTE — Progress Notes (Signed)
Subjective:    Patient ID: Brian Guzman, male    DOB: 12-01-1965, 57 y.o.   MRN: [id number]  HPI Patient is here today for complete physical exam.  Patient had a cardiac CT scan that showed a calcium score of 0.  His last colonoscopy was in 2018 and is due again in 2028.  At the time of this dictation, his PSA is pending.  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  He denies any chest pain shortness of breath or dyspnea on exertion.  He has stopped his thyroid hormone medication and his TSH remains within normal range.  His blood sugar is slightly elevated at 101 however he is drinking a lot of soft drinks and he is going to try to cut back on that to correct this.  His cholesterol is slightly elevated.  Again we performed a coronary artery calcium score 2 years ago that was 0 and therefore we are not treating his cholesterol.  He is retiring in 1 week and plans to start exercising on a daily basis and making drastic changes in his diet Lab on 09/04/2022  Component Date Value Ref Range Status   WBC 09/04/2022 5.9  3.8 - 10.8 Thousand/uL Final   RBC 09/04/2022 5.10  4.20 - 5.80 Million/uL Final   Hemoglobin 09/04/2022 15.9  13.2 - 17.1 g/dL Final   HCT 28/41/3244 47.0  38.5 - 50.0 % Final   MCV 09/04/2022 92.2  80.0 - 100.0 fL Final   MCH 09/04/2022 31.2  27.0 - 33.0 pg Final   MCHC 09/04/2022 33.8  32.0 - 36.0 g/dL Final   RDW 01/03/7251 12.2  11.0 - 15.0 % Final   Platelets 09/04/2022 259  140 - 400 Thousand/uL Final   MPV 09/04/2022 12.4  7.5 - 12.5 fL Final   Neutro Abs 09/04/2022 2,791  1,500 - 7,800 cells/uL Final   Lymphs Abs 09/04/2022 2,136  850 - 3,900 cells/uL Final   Absolute Monocytes 09/04/2022 684  200 - 950 cells/uL Final   Eosinophils Absolute 09/04/2022 201  15 - 500 cells/uL Final   Basophils Absolute 09/04/2022 89  0 - 200 cells/uL Final   Neutrophils Relative % 09/04/2022 47.3  % Final   Total Lymphocyte 09/04/2022 36.2  % Final   Monocytes  Relative 09/04/2022 11.6  % Final   Eosinophils Relative 09/04/2022 3.4  % Final   Basophils Relative 09/04/2022 1.5  % Final   Glucose, Bld 09/04/2022 101 (H)  65 - 99 mg/dL Final   Comment: .            Fasting reference interval . For someone without known diabetes, a glucose value between 100 and 125 mg/dL is consistent with prediabetes and should be confirmed with a follow-up test. .    BUN 09/04/2022 12  7 - 25 mg/dL Final   Creat 66/44/0347 0.87  0.70 - 1.30 mg/dL Final   eGFR 42/59/5638 101  > OR = 60 mL/min/1.74m2 Final   BUN/Creatinine Ratio 09/04/2022 SEE NOTE:  6 - 22 (calc) Final   Comment:    Not Reported: BUN and Creatinine are within    reference range. .    Sodium 09/04/2022 138  135 - 146 mmol/L Final   Potassium 09/04/2022 4.1  3.5 - 5.3 mmol/L Final   Chloride 09/04/2022 100  98 - 110 mmol/L Final   CO2 09/04/2022 26  20 - 32 mmol/L Final   Calcium 09/04/2022 9.6  8.6 - 10.3  mg/dL Final   Total Protein 16/10/9602 7.9  6.1 - 8.1 g/dL Final   Albumin 54/09/8117 4.5  3.6 - 5.1 g/dL Final   Globulin 14/78/2956 3.4  1.9 - 3.7 g/dL (calc) Final   AG Ratio 09/04/2022 1.3  1.0 - 2.5 (calc) Final   Total Bilirubin 09/04/2022 0.7  0.2 - 1.2 mg/dL Final   Alkaline phosphatase (APISO) 09/04/2022 72  35 - 144 U/L Final   AST 09/04/2022 12  10 - 35 U/L Final   ALT 09/04/2022 15  9 - 46 U/L Final   Cholesterol 09/04/2022 185  <200 mg/dL Final   HDL 21/30/8657 38 (L)  > OR = 40 mg/dL Final   Triglycerides 84/69/6295 111  <150 mg/dL Final   LDL Cholesterol (Calc) 09/04/2022 125 (H)  mg/dL (calc) Final   Comment: Reference range: <100 . Desirable range <100 mg/dL for primary prevention;   <70 mg/dL for patients with CHD or diabetic patients  with > or = 2 CHD risk factors. Marland Kitchen LDL-C is now calculated using the Martin-Hopkins  calculation, which is a validated novel method providing  better accuracy than the Friedewald equation in the  estimation of LDL-C.  Horald Pollen et  al. Lenox Ahr. 2841;324(40): 2061-2068  (http://education.[employer].com/faq/FAQ164)    Total CHOL/HDL Ratio 09/04/2022 4.9  <1.0 (calc) Final   Non-HDL Cholesterol (Calc) 09/04/2022 147 (H)  <130 mg/dL (calc) Final   Comment: For patients with diabetes plus 1 major ASCVD risk  factor, treating to a non-HDL-C goal of <100 mg/dL  (LDL-C of <27 mg/dL) is considered a therapeutic  option.    TSH 09/04/2022 3.01  0.40 - 4.50 mIU/L Final   Immunization History  Administered Date(s) Administered   PFIZER(Purple Top)SARS-COV-2 Vaccination 09/15/2019, 01/05/2020   Tdap 04/22/2017   Zoster Recombinant(Shingrix) 04/16/2016, 08/28/2016   Past Medical History:  Diagnosis Date   Hypogonadism in male    Hypothyroidism    White coat syndrome without diagnosis of hypertension    Past Surgical History:  Procedure Laterality Date   BREAST SURGERY     removed breast tissue   COLONOSCOPY N/A 09/12/2016   Procedure: COLONOSCOPY;  Surgeon: Malissa Hippo, MD;  Location: [facility];  Service: Endoscopy;  Laterality: N/A;  830   COSMETIC SURGERY     gyecomastia reduction   LUMBAR LAMINECTOMY/DECOMPRESSION MICRODISCECTOMY Right 11/17/2014   Procedure: CENTRAL DECOMPRESSIVE LUMBAR LAMINECTOMY AT L4-L5.  MICRODISCECTOMY AT L4-L5 ON RIGHT.  FORAMINOTOMY FOR THE L4 AND L5 NERVE ROOT ON THE RIGHT.;  Surgeon: Ranee Gosselin, MD;  Location: [facility];  Service: Orthopedics;  Laterality: Right;   TONSILLECTOMY     Current Outpatient Medications on File Prior to Visit  Medication Sig Dispense Refill   ALPRAZolam (XANAX) 0.5 MG tablet TAKE 1 TABLET (0.5 MG TOTAL) BY MOUTH AT BEDTIME AS NEEDED FOR SLEEP. 30 tablet 0   clobetasol cream (TEMOVATE) 0.05 % APPLY TOPICALLY TWICE A DAY 30 g 0   diclofenac (VOLTAREN) 75 MG EC tablet TAKE 1 TABLET BY MOUTH TWICE A DAY 60 tablet 0   finasteride (PROSCAR) 5 MG tablet Take 1 tablet (5 mg total) by mouth daily. 90 tablet 3   losartan-hydrochlorothiazide (HYZAAR) 100-25  MG tablet TAKE 1 TABLET BY MOUTH EVERY DAY 90 tablet 0   minoxidil (LONITEN) 2.5 MG tablet Take 0.5 tablets (1.25 mg total) by mouth every evening. 90 tablet 3   No current facility-administered medications on file prior to visit.   No Known Allergies Social History   Socioeconomic  History   Marital status: Single    Spouse name: Not on file   Number of children: Not on file   Years of education: Not on file   Highest education level: Not on file  Occupational History   Not on file  Tobacco Use   Smoking status: Never   Smokeless tobacco: Never  Vaping Use   Vaping status: Never Used  Substance and Sexual Activity   Alcohol use: No   Drug use: No   Sexual activity: Not on file  Other Topics Concern   Not on file  Social History Narrative   Not on file   Social Determinants of Health   Financial Resource Strain: Not on file  Food Insecurity: Not on file  Transportation Needs: Not on file  Physical Activity: Not on file  Stress: Not on file  Social Connections: Not on file  Intimate Partner Violence: Not on file   Family History  Problem Relation Age of Onset   Hypertension Mother    Kidney disease Mother    Heart disease Mother    Hypertension Father    Colon cancer Neg Hx    Mother had a history of congestive heart failure and renal failure. Father has a history of hypertension   Review of Systems  All other systems reviewed and are negative.      Objective:   Physical Exam Vitals reviewed.  Constitutional:      General: He is not in acute distress.    Appearance: He is well-developed. He is not diaphoretic.  HENT:     Head: Normocephalic and atraumatic.     Right Ear: External ear normal.     Left Ear: External ear normal.     Nose: Nose normal.     Mouth/Throat:     Pharynx: No oropharyngeal exudate.  Eyes:     General: No scleral icterus.       Right eye: No discharge.        Left eye: No discharge.     Conjunctiva/sclera: Conjunctivae  normal.     Pupils: Pupils are equal, round, and reactive to light.  Neck:     Thyroid: No thyromegaly.     Vascular: No JVD.     Trachea: No tracheal deviation.  Cardiovascular:     Rate and Rhythm: Normal rate and regular rhythm.     Heart sounds: Normal heart sounds. No murmur heard.    No friction rub. No gallop.  Pulmonary:     Effort: Pulmonary effort is normal. No respiratory distress.     Breath sounds: Normal breath sounds. No stridor. No wheezing or rales.  Chest:     Chest wall: No tenderness.  Abdominal:     General: Bowel sounds are normal. There is no distension.     Palpations: Abdomen is soft. There is no mass.     Tenderness: There is no abdominal tenderness. There is no guarding or rebound.  Musculoskeletal:        General: No tenderness. Normal range of motion.     Cervical back: Normal range of motion and neck supple.  Lymphadenopathy:     Cervical: No cervical adenopathy.  Skin:    General: Skin is warm.     Coloration: Skin is not pale.     Findings: No erythema or rash.  Neurological:     Mental Status: He is alert and oriented to person, place, and time.     Cranial Nerves: No cranial nerve deficit.  Motor: No abnormal muscle tone.     Coordination: Coordination normal.     Deep Tendon Reflexes: Reflexes are normal and symmetric.  Psychiatric:        Behavior: Behavior normal.        Thought Content: Thought content normal.        Judgment: Judgment normal.           Assessment & Plan:   General medical exam  Primary hypertension  Hypothyroidism, unspecified type  Prostate cancer screening Patient's physical exam today is completely normal.  Blood pressure is outstanding.  Colon cancer screening and prostate cancer screening are up-to-date.  PSA is pending.  TSH is within normal limits.  I did recommend adding fish oil 2000 mg daily to help lower his cholesterol.  Otherwise, I asked the patient simply to cut back on his consumption of  sugar containing beverages to help address his elevated blood sugar.  The remainder of his lab work is excellent

## 2022-09-11 ENCOUNTER — Other Ambulatory Visit: Payer: Self-pay | Admitting: Family Medicine

## 2022-09-11 LAB — LIPID PANEL
Cholesterol: 185 mg/dL (ref <200)
HDL: 38 mg/dL — ABNORMAL LOW (ref >=40)
LDL Cholesterol (Calc): 125 mg/dL — ABNORMAL HIGH
Non-HDL Cholesterol (Calc): 147 mg/dL — ABNORMAL HIGH (ref <130)
Total CHOL/HDL Ratio: 4.9 (calc) (ref <5.0)
Triglycerides: 111 mg/dL (ref <150)

## 2022-09-11 LAB — TEST AUTHORIZATION

## 2022-09-11 LAB — CBC WITH DIFFERENTIAL/PLATELET
Absolute Monocytes: 684 {cells}/uL (ref 200–950)
Basophils Absolute: 89 {cells}/uL (ref 0–200)
Basophils Relative: 1.5 %
Eosinophils Absolute: 201 {cells}/uL (ref 15–500)
Eosinophils Relative: 3.4 %
HCT: 47 % (ref 38.5–50.0)
Hemoglobin: 15.9 g/dL (ref 13.2–17.1)
Lymphs Abs: 2136 {cells}/uL (ref 850–3900)
MCH: 31.2 pg (ref 27.0–33.0)
MCHC: 33.8 g/dL (ref 32.0–36.0)
MCV: 92.2 fL (ref 80.0–100.0)
MPV: 12.4 fL (ref 7.5–12.5)
Monocytes Relative: 11.6 %
Neutro Abs: 2791 {cells}/uL (ref 1500–7800)
Neutrophils Relative %: 47.3 %
Platelets: 259 10*3/uL (ref 140–400)
RBC: 5.1 10*6/uL (ref 4.20–5.80)
RDW: 12.2 % (ref 11.0–15.0)
Total Lymphocyte: 36.2 %
WBC: 5.9 10*3/uL (ref 3.8–10.8)

## 2022-09-11 LAB — COMPLETE METABOLIC PANEL WITH GFR
AG Ratio: 1.3 (calc) (ref 1.0–2.5)
ALT: 15 U/L (ref 9–46)
AST: 12 U/L (ref 10–35)
Albumin: 4.5 g/dL (ref 3.6–5.1)
Alkaline phosphatase (APISO): 72 U/L (ref 35–144)
BUN: 12 mg/dL (ref 7–25)
CO2: 26 mmol/L (ref 20–32)
Calcium: 9.6 mg/dL (ref 8.6–10.3)
Chloride: 100 mmol/L (ref 98–110)
Creat: 0.87 mg/dL (ref 0.70–1.30)
Globulin: 3.4 g/dL (ref 1.9–3.7)
Glucose, Bld: 101 mg/dL — ABNORMAL HIGH (ref 65–99)
Potassium: 4.1 mmol/L (ref 3.5–5.3)
Sodium: 138 mmol/L (ref 135–146)
Total Bilirubin: 0.7 mg/dL (ref 0.2–1.2)
Total Protein: 7.9 g/dL (ref 6.1–8.1)
eGFR: 101 mL/min/{1.73_m2} (ref >=60)

## 2022-09-11 LAB — PSA: PSA: 0.07 ng/mL (ref <=4.00)

## 2022-09-11 LAB — TSH: TSH: 3.01 m[IU]/L (ref 0.40–4.50)

## 2022-09-12 ENCOUNTER — Other Ambulatory Visit: Payer: Self-pay | Admitting: Family Medicine

## 2022-11-01 ENCOUNTER — Other Ambulatory Visit: Payer: Self-pay

## 2022-11-01 DIAGNOSIS — E291 Testicular hypofunction: Secondary | ICD-10-CM

## 2022-11-01 MED ORDER — MINOXIDIL 2.5 MG PO TABS: 1.2500 mg | ORAL_TABLET | Freq: Every evening | ORAL | 3 refills | Status: DC

## 2022-11-12 ENCOUNTER — Telehealth: Payer: Self-pay

## 2022-11-12 NOTE — Telephone Encounter (Signed)
Pt came by office and asked if we can send in anything stronger for his hemorrhoids? Pt states he has used the Clobetasol cream, hydrocortisone suppositories and OTC meds but they are not helping. Thanks.

## 2022-11-20 ENCOUNTER — Other Ambulatory Visit: Payer: Self-pay | Admitting: Family Medicine

## 2022-11-20 ENCOUNTER — Other Ambulatory Visit: Payer: Self-pay

## 2022-11-20 ENCOUNTER — Telehealth: Payer: Self-pay

## 2022-11-20 MED ORDER — CLOBETASOL PROPIONATE 0.05 % EX CREA: TOPICAL_CREAM | Freq: Two times a day (BID) | CUTANEOUS | 0 refills | Status: DC

## 2022-11-20 NOTE — Telephone Encounter (Signed)
Prescription Request  11/20/2022  LOV: 09/10/22 cpe  What is the name of the medication or equipment? clobetasol cream (TEMOVATE) 0.05 % [[id number]]  Have you contacted your pharmacy to request a refill? Yes   Which pharmacy would you like this sent to?  [id number] [place], [place] - [address] AT [facility] [address] [place] [address] Phone: [phone] Fax: [phone]    Patient notified that their request is being sent to the clinical staff for review and that they should receive a response within 2 business days.   Please advise at [facility] [phone]

## 2022-11-26 ENCOUNTER — Other Ambulatory Visit: Payer: Self-pay

## 2022-11-26 DIAGNOSIS — K649 Unspecified hemorrhoids: Secondary | ICD-10-CM

## 2022-12-03 ENCOUNTER — Other Ambulatory Visit: Payer: Self-pay | Admitting: Family Medicine

## 2022-12-04 NOTE — Telephone Encounter (Signed)
Requested medication (s) are due for refill today - yes  Requested medication (s) are on the active medication list -yes  Future visit scheduled -yes  Last refill: 08/27/22 #30  Notes to clinic: non delegated Rx  Requested Prescriptions  Pending Prescriptions Disp Refills   ALPRAZolam (XANAX) 0.5 MG tablet [Pharmacy Med Name: ALPRAZOLAM 0.5 MG TABLET] 30 tablet 0    Sig: TAKE 1 TABLET (0.5 MG TOTAL) BY MOUTH AT BEDTIME AS NEEDED FOR SLEEP.     Not Delegated - Psychiatry: Anxiolytics/Hypnotics 2 Failed - 12/03/2022 11:11 PM      Failed - This refill cannot be delegated      Failed - Urine Drug Screen completed in last 360 days      Failed - Valid encounter within last 6 months    Recent Outpatient Visits           1 year ago Acute pain of right knee   [facility] Pickard, Priscille Heidelberg, MD   1 year ago Dysuria   [facility] Tanya Nones, Priscille Heidelberg, MD   2 years ago General medical exam   [facility] Donita Brooks, MD   2 years ago Palpitations   [facility] Tanya Nones, Priscille Heidelberg, MD   3 years ago General medical exam   [facility] Donita Brooks, MD       Future Appointments             In 9 months Pickard, Priscille Heidelberg, MD [facility], [facility]            Passed - Patient is not pregnant         Requested Prescriptions  Pending Prescriptions Disp Refills   ALPRAZolam (XANAX) 0.5 MG tablet [Pharmacy Med Name: ALPRAZOLAM 0.5 MG TABLET] 30 tablet 0    Sig: TAKE 1 TABLET (0.5 MG TOTAL) BY MOUTH AT BEDTIME AS NEEDED FOR SLEEP.     Not Delegated - Psychiatry: Anxiolytics/Hypnotics 2 Failed - 12/03/2022 11:11 PM      Failed - This refill cannot be delegated      Failed - Urine Drug Screen completed in last 360 days      Failed - Valid encounter within last 6 months    Recent Outpatient Visits           1 year ago Acute pain of right knee   [facility] Donita Brooks, MD   1 year ago Dysuria   [facility] Donita Brooks, MD   2 years ago General medical exam   [facility] Donita Brooks, MD   2 years ago Palpitations   [facility] Tanya Nones, Priscille Heidelberg, MD   3 years ago General medical exam   [facility] Donita Brooks, MD       Future Appointments             In 9 months Pickard, Priscille Heidelberg, MD  [facility], [facility]            Passed - Patient is not pregnant

## 2022-12-12 ENCOUNTER — Encounter: Payer: Self-pay | Admitting: Nurse Practitioner

## 2022-12-12 ENCOUNTER — Ambulatory Visit: Payer: 59 | Admitting: Nurse Practitioner

## 2022-12-12 ENCOUNTER — Other Ambulatory Visit: Payer: Self-pay | Admitting: Family Medicine

## 2022-12-12 VITALS — BP 118/78 | HR 100 | Ht 66.25 in | Wt 189.5 lb

## 2022-12-12 DIAGNOSIS — K648 Other hemorrhoids: Secondary | ICD-10-CM

## 2022-12-12 DIAGNOSIS — K625 Hemorrhage of anus and rectum: Secondary | ICD-10-CM | POA: Diagnosis not present

## 2022-12-12 DIAGNOSIS — K6289 Other specified diseases of anus and rectum: Secondary | ICD-10-CM

## 2022-12-12 MED ORDER — DILTIAZEM GEL 2 %: 1.0000 | Freq: Three times a day (TID) | CUTANEOUS | 1 refills | Status: DC

## 2022-12-12 NOTE — Patient Instructions (Addendum)
We have sent a prescription for Diltiazem gel to [facility]. You should apply a pea size amount to your rectum three times daily x 6-8 weeks.  [facility]'s information is below: Address: [address], [place], [place] [address]  [phone]  *Please DO NOT go directly from our office to pick up this medication! Give the pharmacy 1 day to process the prescription as this is compounded and takes time to make.     Treatment of anal fissure  Apply a pea size amount of Diltiazem ointment to finger tip. Gently insert finger TIP into rectum.  Move finger in a circular fashion inside rectum to spread the ointment around.  You may use a small amount of Vaseline to help coat your finger if desired.  Apply this ointment three times daily for 6 weeks.  Warm tub soaks for 10-15 minutes 2-3 times daily. Make sure you dry area afterwards 1-2  stool softeners ( Docusate sodium) at bedtime Start daily Metamucil  Drink 60 ounces of water daily Call office if not improving within a few weeks or if recurrent symptoms after completion of treatment. Otherwise, follow up with me in February

## 2022-12-12 NOTE — Progress Notes (Signed)
Brief Narrative 57 y.o. yo male, new to the practice with a past medical history not limited to hypothyroidism, hypertension. Previously followed by Dr Karilyn Cota. Referred by PCP for hemorrhoids and inquires about banding.    ASSESSMENT    Rectal pain / isolated episode of rectal bleeding following a "difficult " bowel movement.  Based on history and exam I suspect an anal fissure though one was not appreciated on exam.  He certainly has swollen internal hemorrhoids but they are not generally associated with such discomfort  See [facility] for any additional medical history   PLAN   Diltiazem 2% with lidocaine ointment -apply three times a day for 6-8 weeks as instructed in the office.  Continue 1-2  stool softeners ( Docusate sodium) daily  Start daily Metamucil  Drink 60 ounces of water daily Warm tub soaks TID Call office if not improving within a few weeks,  or if recurrent symptoms after completion of treatment.  Otherwise, follow up in a couple of months     HPI   Chief complaint : perianal pain and episode of rectal bleeding  Brian Guzman gives a history of hemorrhoids but for weeks has been having a lot more rectal pain than usual with his hemorrhoids.  Prior to the onset of pain he had a  "difficult" BM.  Pain associated with only 1 episode of rectal bleeding and that was early on.  When pain started he began taking 2 stool softeners a day. Stool are softer now.  No abdominal pain.  Otherwise feels fine. He has been applying clobetasol cream perianally since symptoms started.     GI History / Studies   Screening colonoscopy Sept 2018 - Dr. Karilyn Cota --Excellent bowel prep --Entire colon was normal  --External hemorrhoids   Labs      Latest Ref Rng & Units 09/04/2022    8:19 AM 09/05/2021    8:49 AM 08/30/2020    8:21 AM  CBC  WBC 3.8 - 10.8 Thousand/uL 5.9  5.0  5.5   Hemoglobin 13.2 - 17.1 g/dL 16.1  09.6  04.5   Hematocrit 38.5 - 50.0 % 47.0  41.6  45.6   Platelets 140 -  400 Thousand/uL 259  233  220     No results found for: "LIPASE"    Latest Ref Rng & Units 09/04/2022    8:19 AM 09/05/2021    8:49 AM 08/30/2020    8:21 AM  CMP  Glucose 65 - 99 mg/dL 409  91  97   BUN 7 - 25 mg/dL 12  12  12    Creatinine 0.70 - 1.30 mg/dL 8.11  9.14  7.82   Sodium 135 - 146 mmol/L 138  139  139   Potassium 3.5 - 5.3 mmol/L 4.1  3.8  4.1   Chloride 98 - 110 mmol/L 100  103  103   CO2 20 - 32 mmol/L 26  24  24    Calcium 8.6 - 10.3 mg/dL 9.6  9.4  9.3   Total Protein 6.1 - 8.1 g/dL 7.9  7.5  7.0   Total Bilirubin 0.2 - 1.2 mg/dL 0.7  0.5  0.7   AST 10 - 35 U/L 12  12  11    ALT 9 - 46 U/L 15  16  12       Past Medical History:  Diagnosis Date   Hypogonadism in male    Hypothyroidism    White coat syndrome without diagnosis of hypertension  Past Surgical History:  Procedure Laterality Date   BREAST SURGERY     removed breast tissue   COLONOSCOPY N/A 09/12/2016   Procedure: COLONOSCOPY;  Surgeon: Malissa Hippo, MD;  [facility]: [facility];  Service: Endoscopy;  Laterality: N/A;  830   COSMETIC SURGERY     gyecomastia reduction   LUMBAR LAMINECTOMY/DECOMPRESSION MICRODISCECTOMY Right 11/17/2014   Procedure: CENTRAL DECOMPRESSIVE LUMBAR LAMINECTOMY AT L4-L5.  MICRODISCECTOMY AT L4-L5 ON RIGHT.  FORAMINOTOMY FOR THE L4 AND L5 NERVE ROOT ON THE RIGHT.;  Surgeon: Ranee Gosselin, MD;  Location: [facility];  Service: Orthopedics;  Laterality: Right;   TONSILLECTOMY     Family History  Problem Relation Age of Onset   Hypertension Mother    Kidney disease Mother    Heart disease Mother    Hypertension Father    Colon cancer Neg Hx    Social History   Tobacco Use   Smoking status: Never   Smokeless tobacco: Never  Vaping Use   Vaping status: Never Used  Substance Use Topics   Alcohol use: No   Drug use: No   Current Outpatient Medications  Medication Sig Dispense Refill   clobetasol cream (TEMOVATE) 0.05 % Apply topically 2 (two) times daily. 30 g 0    finasteride (PROSCAR) 5 MG tablet TAKE 1 TABLET (5 MG TOTAL) BY MOUTH DAILY. 90 tablet 3   losartan-hydrochlorothiazide (HYZAAR) 100-25 MG tablet TAKE 1 TABLET BY MOUTH EVERY DAY 90 tablet 0   minoxidil (LONITEN) 2.5 MG tablet Take 0.5 tablets (1.25 mg total) by mouth every evening. 90 tablet 3   ALPRAZolam (XANAX) 0.5 MG tablet TAKE 1 TABLET (0.5 MG TOTAL) BY MOUTH AT BEDTIME AS NEEDED FOR SLEEP. (Patient not taking: Reported on 12/12/2022) 30 tablet 0   No current facility-administered medications for this visit.   No Known Allergies   Review of Systems: All systems reviewed and negative except where noted in HPI.   Wt Readings from Last 3 Encounters:  12/12/22 189 lb 8 oz (86 kg)  09/10/22 191 lb 12.8 oz (87 kg)  09/05/21 196 lb (88.9 kg)    Physical Exam:  BP 118/78 (BP Location: Left Arm, Patient Position: Sitting, Cuff Size: Normal)   Pulse 100   Ht 5' 6.25" (1.683 m)   Wt 189 lb 8 oz (86 kg)   SpO2 97%   BMI 30.36 kg/m  Constitutional:  Pleasant, generally well appearing male in no acute distress. Psychiatric:  Normal mood and affect. Behavior is normal. EENT: Pupils normal.  Conjunctivae are normal. No scleral icterus. Neck supple.  Cardiovascular: Normal rate, regular rhythm.  Pulmonary/chest: Effort normal and breath sounds normal. No wheezing, rales or rhonchi. Abdominal: Soft, nondistended, nontender. Bowel sounds active throughout. There are no masses palpable. No hepatomegaly. Rectal: No perianal lesions. Mild perianal erythema. On DRE on there discomfort  with palpation of posterior midline. He tolerated about 1/3 of the anoscope being inserted and with that were swollen internal hemorrhoids seen. No obvious fissure in anal vault  Willette Cluster, NP  12/12/2022, 3:25 PM  Cc:  Referring Provider Donita Brooks, MD

## 2022-12-13 ENCOUNTER — Encounter: Payer: Self-pay | Admitting: Family Medicine

## 2022-12-13 MED ORDER — ALPRAZOLAM 0.5 MG PO TABS: 0.5000 mg | ORAL_TABLET | Freq: Every evening | ORAL | 0 refills | Status: DC | PRN

## 2023-02-22 ENCOUNTER — Other Ambulatory Visit: Payer: Self-pay | Admitting: Family Medicine

## 2023-02-22 MED ORDER — ALPRAZOLAM 0.5 MG PO TABS: 0.5000 mg | ORAL_TABLET | Freq: Every evening | ORAL | 0 refills | Status: DC | PRN

## 2023-02-27 ENCOUNTER — Encounter: Payer: Self-pay | Admitting: Family Medicine

## 2023-02-28 ENCOUNTER — Other Ambulatory Visit: Payer: Self-pay

## 2023-03-26 ENCOUNTER — Ambulatory Visit: Admitting: Family Medicine

## 2023-04-04 ENCOUNTER — Other Ambulatory Visit: Payer: Self-pay | Admitting: Family Medicine

## 2023-04-05 NOTE — Telephone Encounter (Signed)
 Requested medication (s) are due for refill today: yes  Requested medication (s) are on the active medication list: yes  Last refill:  12/13/22 #30  Future visit scheduled: yes  Notes to clinic:  Med not delegated to NT to RF   Requested Prescriptions  Pending Prescriptions Disp Refills   ALPRAZolam (XANAX) 0.5 MG tablet [Pharmacy Med Name: ALPRAZOLAM 0.5 MG TABLET] 30 tablet 0    Sig: Take 1 tablet (0.5 mg total) by mouth at bedtime as needed for sleep.     Not Delegated - Psychiatry: Anxiolytics/Hypnotics 2 Failed - 04/05/2023  2:00 PM      Failed - This refill cannot be delegated      Failed - Urine Drug Screen completed in last 360 days      Failed - Valid encounter within last 6 months    Recent Outpatient Visits           6 months ago General medical exam   [facility] Donita Brooks, MD   1 year ago Physical exam, annual   [facility] Pickard, Priscille Heidelberg, MD       Future Appointments             In 5 months Pickard, Priscille Heidelberg, MD [facility], [facility]            Passed - Patient is not pregnant

## 2023-05-26 ENCOUNTER — Other Ambulatory Visit: Payer: Self-pay | Admitting: Family Medicine

## 2023-08-12 ENCOUNTER — Ambulatory Visit: Payer: Self-pay | Admitting: Podiatry

## 2023-08-12 ENCOUNTER — Encounter: Payer: Self-pay | Admitting: Podiatry

## 2023-08-12 DIAGNOSIS — M7752 Other enthesopathy of left foot: Secondary | ICD-10-CM | POA: Diagnosis not present

## 2023-08-12 DIAGNOSIS — Q828 Other specified congenital malformations of skin: Secondary | ICD-10-CM | POA: Diagnosis not present

## 2023-08-12 MED ORDER — TRIAMCINOLONE ACETONIDE 10 MG/ML IJ SUSP
10.0000 mg | Freq: Once | INTRAMUSCULAR | Status: AC
  Administered 2023-08-12 (×2): 10 mg via INTRA_ARTICULAR

## 2023-08-12 NOTE — Progress Notes (Signed)
 Subjective:   Patient ID: Brian Guzman, male   DOB: 58 y.o.   MRN: [id number]   HPI Patient presents with several lesions on both feet that are tender and sore and inflammation of the fifth joint left that he had several years ago.  Been over 2 years since he has been here   ROS      Objective:  Physical Exam  Neurovascular status intact with inflammation pain around the fifth MPJ left with fluid buildup and lesions subthird and fifth metatarsal left and subright midfoot with lucent cores approximate 5 lesions total     Assessment:  Inflammatory capsulitis fifth MPJ left and porokeratotic lesion bilateral     Plan:  Sterile prep and injected the capsule left 3 mg dexamethasone  Kenalog  5 mg Xylocaine  and debrided 5 several lesions slight bleeding because they were so superficial applied bandage with Neosporin and patient will be seen back as symptoms indicate

## 2023-09-06 ENCOUNTER — Other Ambulatory Visit: Payer: 59

## 2023-09-06 DIAGNOSIS — Z0000 Encounter for general adult medical examination without abnormal findings: Secondary | ICD-10-CM | POA: Diagnosis not present

## 2023-09-06 DIAGNOSIS — E291 Testicular hypofunction: Secondary | ICD-10-CM

## 2023-09-07 LAB — COMPLETE METABOLIC PANEL WITHOUT GFR
AG Ratio: 1.6 (calc) (ref 1.0–2.5)
ALT: 15 U/L (ref 9–46)
AST: 12 U/L (ref 10–35)
Albumin: 4.4 g/dL (ref 3.6–5.1)
Alkaline phosphatase (APISO): 66 U/L (ref 35–144)
BUN: 10 mg/dL (ref 7–25)
CO2: 29 mmol/L (ref 20–32)
Calcium: 9.2 mg/dL (ref 8.6–10.3)
Chloride: 101 mmol/L (ref 98–110)
Creat: 0.77 mg/dL (ref 0.70–1.30)
Globulin: 2.7 g/dL (ref 1.9–3.7)
Glucose, Bld: 94 mg/dL (ref 65–99)
Potassium: 4.3 mmol/L (ref 3.5–5.3)
Sodium: 139 mmol/L (ref 135–146)
Total Bilirubin: 0.6 mg/dL (ref 0.2–1.2)
Total Protein: 7.1 g/dL (ref 6.1–8.1)

## 2023-09-07 LAB — CBC WITH DIFFERENTIAL/PLATELET
Absolute Lymphocytes: 1724 {cells}/uL (ref 850–3900)
Absolute Monocytes: 794 {cells}/uL (ref 200–950)
Basophils Absolute: 93 {cells}/uL (ref 0–200)
Basophils Relative: 1.5 %
Eosinophils Absolute: 180 {cells}/uL (ref 15–500)
Eosinophils Relative: 2.9 %
HCT: 46.3 % (ref 38.5–50.0)
Hemoglobin: 15.5 g/dL (ref 13.2–17.1)
MCH: 31.4 pg (ref 27.0–33.0)
MCHC: 33.5 g/dL (ref 32.0–36.0)
MCV: 93.9 fL (ref 80.0–100.0)
MPV: 11.8 fL (ref 7.5–12.5)
Monocytes Relative: 12.8 %
Neutro Abs: 3410 {cells}/uL (ref 1500–7800)
Neutrophils Relative %: 55 %
Platelets: 253 Thousand/uL (ref 140–400)
RBC: 4.93 Million/uL (ref 4.20–5.80)
RDW: 12.3 % (ref 11.0–15.0)
Total Lymphocyte: 27.8 %
WBC: 6.2 Thousand/uL (ref 3.8–10.8)

## 2023-09-07 LAB — LIPID PANEL
Cholesterol: 157 mg/dL (ref <200)
HDL: 41 mg/dL (ref >=40)
LDL Cholesterol (Calc): 95 mg/dL
Non-HDL Cholesterol (Calc): 116 mg/dL (ref <130)
Total CHOL/HDL Ratio: 3.8 (calc) (ref <5.0)
Triglycerides: 109 mg/dL (ref <150)

## 2023-09-07 LAB — PSA: PSA: 0.09 ng/mL (ref <=4.00)

## 2023-09-07 LAB — TSH: TSH: 1.91 m[IU]/L (ref 0.40–4.50)

## 2023-09-09 ENCOUNTER — Ambulatory Visit: Payer: Self-pay | Admitting: Family Medicine

## 2023-09-10 ENCOUNTER — Encounter: Payer: Self-pay | Admitting: Family Medicine

## 2023-09-10 ENCOUNTER — Ambulatory Visit (INDEPENDENT_AMBULATORY_CARE_PROVIDER_SITE_OTHER): Payer: 59 | Admitting: Family Medicine

## 2023-09-10 VITALS — BP 118/64 | HR 73 | Temp 98.3°F | Ht 66.25 in | Wt 193.4 lb

## 2023-09-10 DIAGNOSIS — Z0000 Encounter for general adult medical examination without abnormal findings: Secondary | ICD-10-CM

## 2023-09-10 DIAGNOSIS — K602 Anal fissure, unspecified: Secondary | ICD-10-CM

## 2023-09-10 DIAGNOSIS — I10 Essential (primary) hypertension: Secondary | ICD-10-CM | POA: Diagnosis not present

## 2023-09-10 DIAGNOSIS — Z0001 Encounter for general adult medical examination with abnormal findings: Secondary | ICD-10-CM | POA: Diagnosis not present

## 2023-09-10 DIAGNOSIS — E039 Hypothyroidism, unspecified: Secondary | ICD-10-CM

## 2023-09-10 DIAGNOSIS — E291 Testicular hypofunction: Secondary | ICD-10-CM | POA: Diagnosis not present

## 2023-09-10 MED ORDER — MINOXIDIL 2.5 MG PO TABS: 2.5000 mg | ORAL_TABLET | Freq: Every evening | ORAL | 3 refills | Status: DC

## 2023-09-10 MED ORDER — DILTIAZEM GEL 2 %: 1.0000 | Freq: Three times a day (TID) | CUTANEOUS | 5 refills | Status: AC

## 2023-09-10 NOTE — Progress Notes (Signed)
 Subjective:    Patient ID: Brian Guzman, male    DOB: May 20, 1965, 58 y.o.   MRN: [id number]  HPI Patient is here today for complete physical exam.  Patient had a cardiac CT scan that showed a calcium score of 0.  His last colonoscopy was in 2018 and is due again in 2028.  Has burning pain in rectum and itching almost on daily basis.  On rectal exam, sphincter spasms causing severe pain.  Small external hemorrhoid at 3 o'clock..  Due for flu and pna vaccine.  Lab on 09/06/2023  Component Date Value Ref Range Status   PSA 09/06/2023 0.09  < OR = 4.00 ng/mL Final   Comment: The total PSA value from this assay system is  standardized against the WHO standard. The test  result will be approximately 20% lower when compared  to the equimolar-standardized total PSA (Beckman  Coulter). Comparison of serial PSA results should be  interpreted with this fact in mind. . This test was performed using the Siemens  chemiluminescent method. Values obtained from  different assay methods cannot be used interchangeably. PSA levels, regardless of value, should not be interpreted as absolute evidence of the presence or absence of disease.    Cholesterol 09/06/2023 157  <200 mg/dL Final   HDL 90/94/7974 41  > OR = 40 mg/dL Final   Triglycerides 90/94/7974 109  <150 mg/dL Final   LDL Cholesterol (Calc) 09/06/2023 95  mg/dL (calc) Final   Comment: Reference range: <100 . Desirable range <100 mg/dL for primary prevention;   <70 mg/dL for patients with CHD or diabetic patients  with > or = 2 CHD risk factors. SABRA LDL-C is now calculated using the Martin-Hopkins  calculation, which is a validated novel method providing  better accuracy than the Friedewald equation in the  estimation of LDL-C.  Gladis APPLETHWAITE et al. SANDREA. 7986;689(80): 2061-2068  (http://education.[employer].com/faq/FAQ164)    Total CHOL/HDL Ratio 09/06/2023 3.8  <4.9 (calc) Final   Non-HDL Cholesterol (Calc) 09/06/2023 116  <130  mg/dL (calc) Final   Comment: For patients with diabetes plus 1 major ASCVD risk  factor, treating to a non-HDL-C goal of <100 mg/dL  (LDL-C of <29 mg/dL) is considered a therapeutic  option.    TSH 09/06/2023 1.91  0.40 - 4.50 mIU/L Final   Glucose, Bld 09/06/2023 94  65 - 99 mg/dL Final   Comment: .            Fasting reference interval .    BUN 09/06/2023 10  7 - 25 mg/dL Final   Creat 90/94/7974 0.77  0.70 - 1.30 mg/dL Final   BUN/Creatinine Ratio 09/06/2023 SEE NOTE:  6 - 22 (calc) Final   Comment:    Not Reported: BUN and Creatinine are within    reference range. .    Sodium 09/06/2023 139  135 - 146 mmol/L Final   Potassium 09/06/2023 4.3  3.5 - 5.3 mmol/L Final   Chloride 09/06/2023 101  98 - 110 mmol/L Final   CO2 09/06/2023 29  20 - 32 mmol/L Final   Calcium 09/06/2023 9.2  8.6 - 10.3 mg/dL Final   Total Protein 90/94/7974 7.1  6.1 - 8.1 g/dL Final   Albumin 90/94/7974 4.4  3.6 - 5.1 g/dL Final   Globulin 90/94/7974 2.7  1.9 - 3.7 g/dL (calc) Final   AG Ratio 09/06/2023 1.6  1.0 - 2.5 (calc) Final   Total Bilirubin 09/06/2023 0.6  0.2 - 1.2 mg/dL Final   Alkaline  phosphatase (APISO) 09/06/2023 66  35 - 144 U/L Final   AST 09/06/2023 12  10 - 35 U/L Final   ALT 09/06/2023 15  9 - 46 U/L Final   WBC 09/06/2023 6.2  3.8 - 10.8 Thousand/uL Final   RBC 09/06/2023 4.93  4.20 - 5.80 Million/uL Final   Hemoglobin 09/06/2023 15.5  13.2 - 17.1 g/dL Final   HCT 90/94/7974 46.3  38.5 - 50.0 % Final   MCV 09/06/2023 93.9  80.0 - 100.0 fL Final   MCH 09/06/2023 31.4  27.0 - 33.0 pg Final   MCHC 09/06/2023 33.5  32.0 - 36.0 g/dL Final   Comment: For adults, a slight decrease in the calculated MCHC value (in the range of 30 to 32 g/dL) is most likely not clinically significant; however, it should be interpreted with caution in correlation with other red cell parameters and the patient's clinical condition.    RDW 09/06/2023 12.3  11.0 - 15.0 % Final   Platelets 09/06/2023 253   140 - 400 Thousand/uL Final   MPV 09/06/2023 11.8  7.5 - 12.5 fL Final   Neutro Abs 09/06/2023 3,410  1,500 - 7,800 cells/uL Final   Absolute Lymphocytes 09/06/2023 1,724  850 - 3,900 cells/uL Final   Absolute Monocytes 09/06/2023 794  200 - 950 cells/uL Final   Eosinophils Absolute 09/06/2023 180  15 - 500 cells/uL Final   Basophils Absolute 09/06/2023 93  0 - 200 cells/uL Final   Neutrophils Relative % 09/06/2023 55  % Final   Total Lymphocyte 09/06/2023 27.8  % Final   Monocytes Relative 09/06/2023 12.8  % Final   Eosinophils Relative 09/06/2023 2.9  % Final   Basophils Relative 09/06/2023 1.5  % Final   Immunization History  Administered Date(s) Administered   PFIZER(Purple Top)SARS-COV-2 Vaccination 09/15/2019, 01/05/2020   Tdap 04/22/2017   Zoster Recombinant(Shingrix ) 04/16/2016, 08/28/2016   Past Medical History:  Diagnosis Date   Hypogonadism in male    Hypothyroidism    White coat syndrome without diagnosis of hypertension    Past Surgical History:  Procedure Laterality Date   BREAST SURGERY     removed breast tissue   COLONOSCOPY N/A 09/12/2016   Procedure: COLONOSCOPY;  Surgeon: Golda Claudis PENNER, MD;  Location: [facility];  Service: Endoscopy;  Laterality: N/A;  830   COSMETIC SURGERY     gyecomastia reduction   LUMBAR LAMINECTOMY/DECOMPRESSION MICRODISCECTOMY Right 11/17/2014   Procedure: CENTRAL DECOMPRESSIVE LUMBAR LAMINECTOMY AT L4-L5.  MICRODISCECTOMY AT L4-L5 ON RIGHT.  FORAMINOTOMY FOR THE L4 AND L5 NERVE ROOT ON THE RIGHT.;  Surgeon: Tanda Heading, MD;  Location: [facility];  Service: Orthopedics;  Laterality: Right;   TONSILLECTOMY     Current Outpatient Medications on File Prior to Visit  Medication Sig Dispense Refill   ALPRAZolam  (XANAX ) 0.5 MG tablet TAKE 1 TABLET (0.5 MG TOTAL) BY MOUTH AT BEDTIME AS NEEDED FOR SLEEP. 30 tablet 0   ALPRAZolam  (XANAX ) 0.5 MG tablet TAKE 1 TABLET (0.5 MG TOTAL) BY MOUTH AT BEDTIME AS NEEDED FOR SLEEP. 30 tablet 0    diltiazem  2 % GEL Apply 1 Application topically 3 (three) times daily. 30 g 1   finasteride  (PROSCAR ) 5 MG tablet TAKE 1 TABLET (5 MG TOTAL) BY MOUTH DAILY. 90 tablet 3   losartan -hydrochlorothiazide  (HYZAAR) 100-25 MG tablet TAKE 1 TABLET BY MOUTH EVERY DAY 90 tablet 0   minoxidil  (LONITEN ) 2.5 MG tablet Take 0.5 tablets (1.25 mg total) by mouth every evening. 90 tablet 3  No current facility-administered medications on file prior to visit.   No Known Allergies Social History   Socioeconomic History   Marital status: Single    Spouse name: Not on file   Number of children: Not on file   Years of education: Not on file   Highest education level: Not on file  Occupational History   Not on file  Tobacco Use   Smoking status: Never   Smokeless tobacco: Never  Vaping Use   Vaping status: Never Used  Substance and Sexual Activity   Alcohol use: No   Drug use: No   Sexual activity: Not on file  Other Topics Concern   Not on file  Social History Narrative   Not on file   Social Drivers of Health   Financial Resource Strain: Not on file  Food Insecurity: Not on file  Transportation Needs: Not on file  Physical Activity: Not on file  Stress: Not on file  Social Connections: Not on file  Intimate Partner Violence: Not on file   Family History  Problem Relation Age of Onset   Hypertension Mother    Kidney disease Mother    Heart disease Mother    Hypertension Father    Colon cancer Neg Hx    Mother had a history of congestive heart failure and renal failure. Father has a history of hypertension   Review of Systems  All other systems reviewed and are negative.      Objective:   Physical Exam Vitals reviewed.  Constitutional:      General: He is not in acute distress.    Appearance: He is well-developed. He is not diaphoretic.  HENT:     Head: Normocephalic and atraumatic.     Right Ear: External ear normal.     Left Ear: External ear normal.     Nose: Nose  normal.     Mouth/Throat:     Pharynx: No oropharyngeal exudate.  Eyes:     General: No scleral icterus.       Right eye: No discharge.        Left eye: No discharge.     Conjunctiva/sclera: Conjunctivae normal.     Pupils: Pupils are equal, round, and reactive to light.  Neck:     Thyroid : No thyromegaly.     Vascular: No JVD.     Trachea: No tracheal deviation.  Cardiovascular:     Rate and Rhythm: Normal rate and regular rhythm.     Heart sounds: Normal heart sounds. No murmur heard.    No friction rub. No gallop.  Pulmonary:     Effort: Pulmonary effort is normal. No respiratory distress.     Breath sounds: Normal breath sounds. No stridor. No wheezing or rales.  Chest:     Chest wall: No tenderness.  Abdominal:     General: Bowel sounds are normal. There is no distension.     Palpations: Abdomen is soft. There is no mass.     Tenderness: There is no abdominal tenderness. There is no guarding or rebound.  Genitourinary:    Rectum: Tenderness and external hemorrhoid present. Abnormal anal tone.  Musculoskeletal:        General: No tenderness. Normal range of motion.     Cervical back: Normal range of motion and neck supple.  Lymphadenopathy:     Cervical: No cervical adenopathy.  Skin:    General: Skin is warm.     Coloration: Skin is not pale.  Findings: No erythema or rash.  Neurological:     Mental Status: He is alert and oriented to person, place, and time.     Cranial Nerves: No cranial nerve deficit.     Motor: No abnormal muscle tone.     Coordination: Coordination normal.     Deep Tendon Reflexes: Reflexes are normal and symmetric.  Psychiatric:        Behavior: Behavior normal.        Thought Content: Thought content normal.        Judgment: Judgment normal.           Assessment & Plan:   Anal fissure - Plan: Ambulatory referral to Gastroenterology  Hypogonadism in male - Plan: minoxidil  (LONITEN ) 2.5 MG tablet  General medical  exam  Primary hypertension  Hypothyroidism, unspecified type Recommended diltiazem  gel to relieve rectal spasms and heal anal fissure.  Consult gi.  Colonoscopy utd.  Psa is excellent. Declines flu and pna vaccine.  Other labs are excellent.

## 2023-09-16 ENCOUNTER — Encounter (INDEPENDENT_AMBULATORY_CARE_PROVIDER_SITE_OTHER): Payer: Self-pay | Admitting: *Deleted

## 2023-09-26 ENCOUNTER — Encounter: Payer: Self-pay | Admitting: Podiatry

## 2023-09-26 ENCOUNTER — Ambulatory Visit: Admitting: Podiatry

## 2023-09-26 ENCOUNTER — Ambulatory Visit

## 2023-09-26 VITALS — Ht 66.25 in | Wt 193.4 lb

## 2023-09-26 DIAGNOSIS — L600 Ingrowing nail: Secondary | ICD-10-CM

## 2023-09-26 DIAGNOSIS — B070 Plantar wart: Secondary | ICD-10-CM

## 2023-09-26 DIAGNOSIS — M7751 Other enthesopathy of right foot: Secondary | ICD-10-CM

## 2023-09-26 DIAGNOSIS — M7752 Other enthesopathy of left foot: Secondary | ICD-10-CM

## 2023-09-26 NOTE — Patient Instructions (Signed)

## 2023-09-26 NOTE — Progress Notes (Signed)
 Subjective:   Patient ID: Brian Guzman, male   DOB: 58 y.o.   MRN: [id number]   HPI Patient presents with a lot of pain underneath the left foot stating that it still giving him trouble and also has incurvated nailbed on the right big toe that is painful when pressed and making shoe gear difficult.  Patient has tried to trim it himself and tried to pull it out himself   ROS      Objective:  Physical Exam  Neuro vascular status intact with keratotic lesion on the lateral side of the left fifth metatarsal measuring 4 x 4 mm that shows pinpoint bleeding upon debridement and on the right foot the medial border is incurvated sore with a damaged nail bed overall     Assessment:  Probable verruca plantaris left lateral foot along with ingrown toenail deformity right hallux lateral order     Plan:  H&P reviewed both conditions and for the left I did do deep debridement I then applied chemical agent to create immune response with sterile dressing and for the right I went ahead and I infiltrated 60 mg Xylocaine  Marcaine  mixture after allowing him to reviewed and signed consent form and with sterile prep and sterile equipment I removed the medial border exposed matrix applied phenol 3 applications 30 seconds followed by alcohol lavage sterile dressing.  Instructed on soaks and leave dressing on 24 hours and to call us  if any symptoms were to occur or pathology

## 2023-10-22 ENCOUNTER — Encounter: Payer: Self-pay | Admitting: *Deleted

## 2023-10-22 ENCOUNTER — Encounter: Payer: Self-pay | Admitting: Gastroenterology

## 2023-10-22 ENCOUNTER — Ambulatory Visit: Admitting: Gastroenterology

## 2023-10-22 ENCOUNTER — Telehealth: Payer: Self-pay | Admitting: *Deleted

## 2023-10-22 VITALS — BP 129/87 | HR 82 | Temp 98.6°F | Ht 68.5 in | Wt 196.4 lb

## 2023-10-22 DIAGNOSIS — K6289 Other specified diseases of anus and rectum: Secondary | ICD-10-CM | POA: Diagnosis not present

## 2023-10-22 MED ORDER — KETOCONAZOLE 2 % EX CREA: 1.0000 | TOPICAL_CREAM | Freq: Every day | CUTANEOUS | 1 refills | Status: AC

## 2023-10-22 NOTE — H&P (View-Only) (Signed)
 .       Gastroenterology Office Note    Referring Provider: Duanne Butler DASEN, MD Primary Care Physician:  Duanne Butler DASEN, MD  Primary GI: previously Dr Golda, now Dr Eartha    Chief Complaint   Chief Complaint  Patient presents with   New Patient (Initial Visit)    Referred by Speciality for anal fissures.. pt states he is in no pain and his problem has gotten better     History of Present Illness   Brian Guzman is a 58 y.o. male presenting today at the request of Pickard, Warren T, MD due to hx of anal fissure and concern for prolapsed hemorrhoids. Last colonoscopy was in 2018 by Dr. Golda with normal colon and external hemorrhoids noted.   He presents today noting a year ago saw [facility] for rectal pain and felt to have an anal fissure. He used diltiazem  with lidocaine . This issue improved significantly. At that time of visit, he was unable to have a complete anoscopy due to pain.   Presents now saying pain is not the issue; instead, he has a prolapsing tissue that he feels typically on his left side of rectum that reduces when he squats or lays down. He has not manually reduced this. No rectal bleeding. At times feels rectal burning. No knife-like pain with BMs. Sometimes discomfort in rectum.    Metamucil and Benefiber. Has soft stools. Using Clobetasol  for questionable yeast peri-rectally.   Colonoscopy 2018: external hemorrhoids, otherwise normal.   Past Medical History:  Diagnosis Date   Hypogonadism in male    Hypothyroidism    White coat syndrome without diagnosis of hypertension     Past Surgical History:  Procedure Laterality Date   BREAST SURGERY     removed breast tissue   COLONOSCOPY N/A 09/12/2016   Procedure: COLONOSCOPY;  Surgeon: Golda Claudis PENNER, MD;  Location: [facility];  Service: Endoscopy;  Laterality: N/A;  830   COSMETIC SURGERY     gyecomastia reduction   LUMBAR LAMINECTOMY/DECOMPRESSION MICRODISCECTOMY Right 11/17/2014    Procedure: CENTRAL DECOMPRESSIVE LUMBAR LAMINECTOMY AT L4-L5.  MICRODISCECTOMY AT L4-L5 ON RIGHT.  FORAMINOTOMY FOR THE L4 AND L5 NERVE ROOT ON THE RIGHT.;  Surgeon: Tanda Heading, MD;  Location: [facility];  Service: Orthopedics;  Laterality: Right;   TONSILLECTOMY      Current Outpatient Medications  Medication Sig Dispense Refill   diltiazem  2 % GEL Apply 1 Application topically 3 (three) times daily. 30 g 5   finasteride  (PROSCAR ) 5 MG tablet TAKE 1 TABLET (5 MG TOTAL) BY MOUTH DAILY. 90 tablet 3   ketoconazole (NIZORAL) 2 % cream Apply 1 Application topically daily. Apply at rectum to affected area twice a day. 30 g 1   losartan -hydrochlorothiazide  (HYZAAR) 100-25 MG tablet TAKE 1 TABLET BY MOUTH EVERY DAY 90 tablet 0   minoxidil  (LONITEN ) 2.5 MG tablet Take 1 tablet (2.5 mg total) by mouth every evening. 30 tablet 3   No current facility-administered medications for this visit.    Allergies as of 10/22/2023   (No Known Allergies)    Family History  Problem Relation Age of Onset   Hypertension Mother    Kidney disease Mother    Heart disease Mother    Hypertension Father    Colon cancer Neg Hx    Colon polyps Neg Hx     Social History   Socioeconomic History   Marital status: Single    Spouse name: Not on file   Number  of children: Not on file   Years of education: Not on file   Highest education level: Not on file  Occupational History   Not on file  Tobacco Use   Smoking status: Never   Smokeless tobacco: Never  Vaping Use   Vaping status: Never Used  Substance and Sexual Activity   Alcohol use: No   Drug use: No   Sexual activity: Not on file  Other Topics Concern   Not on file  Social History Narrative   Not on file   Social Drivers of Health   Financial Resource Strain: Not on file  Food Insecurity: Not on file  Transportation Needs: Not on file  Physical Activity: Not on file  Stress: Not on file  Social Connections: Not on file  Intimate Partner  Violence: Not on file     Review of Systems   Gen: Denies any fever, chills, fatigue, weight loss, lack of appetite.  CV: Denies chest pain, heart palpitations, peripheral edema, syncope.  Resp: Denies shortness of breath at rest or with exertion. Denies wheezing or cough.  GI: Denies dysphagia or odynophagia. Denies jaundice, hematemesis, fecal incontinence. GU : Denies urinary burning, urinary frequency, urinary hesitancy MS: Denies joint pain, muscle weakness, cramps, or limitation of movement.  Derm: Denies rash, itching, dry skin Psych: Denies depression, anxiety, memory loss, and confusion Heme: Denies bruising, bleeding, and enlarged lymph nodes.   Physical Exam   BP 129/87   Pulse 82   Temp 98.6 F (37 C)   Ht 5' 8.5 (1.74 m)   Wt 196 lb 6.4 oz (89.1 kg)   BMI 29.43 kg/m  General:   Alert and oriented. Pleasant and cooperative. Well-nourished and well-developed.  Head:  Normocephalic and atraumatic. Eyes:  Without icterus Ears:  Normal auditory acuity. Rectal:  external erythema well-demarcated perianally, query yeast, DRE with slight discomfort and burning, anal sphincter tight. Appears may have left sided internal fullness but painful on exam and difficult to complete DRE due to discomfort.  Msk:  Symmetrical without gross deformities. Normal posture. Extremities:  Without edema. Neurologic:  Alert and  oriented x4;  grossly normal neurologically. Skin:  Intact without significant lesions or rashes. Psych:  Alert and cooperative. Normal mood and affect.   Assessment   Brian Guzman is a 58 y.o. male presenting today at the request of Duanne Butler DASEN, MD due to hx of anal fissure and concern for prolapsed hemorrhoids. Last colonoscopy was in 2018 by Dr. Golda with normal colon and external hemorrhoids noted.   Interestingly, he does have symptoms that coincided with likely anal fissure approximately a year ago, with improvement in pain using compounded  diltiazem  cream.   Currently, I am unable to complete a full exam or perform an anoscopy today due to tight internal sphincter and discomfort that precludes exam. He does seem to have some fullness internally that could be related to internal hemorrhoids, but I discussed with him a flex sig as it has been since 2018 that he has had evaluation. Prolapsing tissue most likely internal hemorrhoid related. 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 Will trial ketoconazole.    PLAN   Ketoconazole cream perirectally  Flex sig with Dr. Eartha in the near future for direct visualization  Continue fiber, stool softener, avoidance of straining, and limit toilet time  Further recommendations to follow  Therisa MICAEL Stager, PhD, ANP-BC [facility]enter[facility]   I have reviewed the  note and agree with the APP's assessment as described in this progress note  Toribio Fortune, MD Gastroenterology and Hepatology [facility] Gastroenterology

## 2023-10-22 NOTE — Progress Notes (Signed)
 .       Gastroenterology Office Note    Referring Provider: Duanne Butler DASEN, MD Primary Care Physician:  Duanne Butler DASEN, MD  Primary GI: previously Dr Golda, now Dr Eartha    Chief Complaint   Chief Complaint  Patient presents with   New Patient (Initial Visit)    Referred by Speciality for anal fissures.. pt states he is in no pain and his problem has gotten better     History of Present Illness   Brian Guzman is a 58 y.o. male presenting today at the request of Pickard, Warren T, MD due to hx of anal fissure and concern for prolapsed hemorrhoids. Last colonoscopy was in 2018 by Dr. Golda with normal colon and external hemorrhoids noted.   He presents today noting a year ago saw [facility] for rectal pain and felt to have an anal fissure. He used diltiazem  with lidocaine . This issue improved significantly. At that time of visit, he was unable to have a complete anoscopy due to pain.   Presents now saying pain is not the issue; instead, he has a prolapsing tissue that he feels typically on his left side of rectum that reduces when he squats or lays down. He has not manually reduced this. No rectal bleeding. At times feels rectal burning. No knife-like pain with BMs. Sometimes discomfort in rectum.    Metamucil and Benefiber. Has soft stools. Using Clobetasol  for questionable yeast peri-rectally.   Colonoscopy 2018: external hemorrhoids, otherwise normal.   Past Medical History:  Diagnosis Date   Hypogonadism in male    Hypothyroidism    White coat syndrome without diagnosis of hypertension     Past Surgical History:  Procedure Laterality Date   BREAST SURGERY     removed breast tissue   COLONOSCOPY N/A 09/12/2016   Procedure: COLONOSCOPY;  Surgeon: Golda Claudis PENNER, MD;  [facility]: [facility];  Service: Endoscopy;  Laterality: N/A;  830   COSMETIC SURGERY     gyecomastia reduction   LUMBAR LAMINECTOMY/DECOMPRESSION MICRODISCECTOMY Right 11/17/2014    Procedure: CENTRAL DECOMPRESSIVE LUMBAR LAMINECTOMY AT L4-L5.  MICRODISCECTOMY AT L4-L5 ON RIGHT.  FORAMINOTOMY FOR THE L4 AND L5 NERVE ROOT ON THE RIGHT.;  Surgeon: Tanda Heading, MD;  Location: [facility];  Service: Orthopedics;  Laterality: Right;   TONSILLECTOMY      Current Outpatient Medications  Medication Sig Dispense Refill   diltiazem  2 % GEL Apply 1 Application topically 3 (three) times daily. 30 g 5   finasteride  (PROSCAR ) 5 MG tablet TAKE 1 TABLET (5 MG TOTAL) BY MOUTH DAILY. 90 tablet 3   ketoconazole (NIZORAL) 2 % cream Apply 1 Application topically daily. Apply at rectum to affected area twice a day. 30 g 1   losartan -hydrochlorothiazide  (HYZAAR) 100-25 MG tablet TAKE 1 TABLET BY MOUTH EVERY DAY 90 tablet 0   minoxidil  (LONITEN ) 2.5 MG tablet Take 1 tablet (2.5 mg total) by mouth every evening. 30 tablet 3   No current facility-administered medications for this visit.    Allergies as of 10/22/2023   (No Known Allergies)    Family History  Problem Relation Age of Onset   Hypertension Mother    Kidney disease Mother    Heart disease Mother    Hypertension Father    Colon cancer Neg Hx    Colon polyps Neg Hx     Social History   Socioeconomic History   Marital status: Single    Spouse name: Not on file   Number  of children: Not on file   Years of education: Not on file   Highest education level: Not on file  Occupational History   Not on file  Tobacco Use   Smoking status: Never   Smokeless tobacco: Never  Vaping Use   Vaping status: Never Used  Substance and Sexual Activity   Alcohol use: No   Drug use: No   Sexual activity: Not on file  Other Topics Concern   Not on file  Social History Narrative   Not on file   Social Drivers of Health   Financial Resource Strain: Not on file  Food Insecurity: Not on file  Transportation Needs: Not on file  Physical Activity: Not on file  Stress: Not on file  Social Connections: Not on file  Intimate Partner  Violence: Not on file     Review of Systems   Gen: Denies any fever, chills, fatigue, weight loss, lack of appetite.  CV: Denies chest pain, heart palpitations, peripheral edema, syncope.  Resp: Denies shortness of breath at rest or with exertion. Denies wheezing or cough.  GI: Denies dysphagia or odynophagia. Denies jaundice, hematemesis, fecal incontinence. GU : Denies urinary burning, urinary frequency, urinary hesitancy MS: Denies joint pain, muscle weakness, cramps, or limitation of movement.  Derm: Denies rash, itching, dry skin Psych: Denies depression, anxiety, memory loss, and confusion Heme: Denies bruising, bleeding, and enlarged lymph nodes.   Physical Exam   BP 129/87   Pulse 82   Temp 98.6 F (37 C)   Ht 5' 8.5 (1.74 m)   Wt 196 lb 6.4 oz (89.1 kg)   BMI 29.43 kg/m  General:   Alert and oriented. Pleasant and cooperative. Well-nourished and well-developed.  Head:  Normocephalic and atraumatic. Eyes:  Without icterus Ears:  Normal auditory acuity. Rectal:  external erythema well-demarcated perianally, query yeast, DRE with slight discomfort and burning, anal sphincter tight. Appears may have left sided internal fullness but painful on exam and difficult to complete DRE due to discomfort.  Msk:  Symmetrical without gross deformities. Normal posture. Extremities:  Without edema. Neurologic:  Alert and  oriented x4;  grossly normal neurologically. Skin:  Intact without significant lesions or rashes. Psych:  Alert and cooperative. Normal mood and affect.   Assessment   Brian Guzman is a 58 y.o. male presenting today at the request of Duanne Butler DASEN, MD due to hx of anal fissure and concern for prolapsed hemorrhoids. Last colonoscopy was in 2018 by Dr. Golda with normal colon and external hemorrhoids noted.   Interestingly, he does have symptoms that coincided with likely anal fissure approximately a year ago, with improvement in pain using compounded  diltiazem  cream.   Currently, I am unable to complete a full exam or perform an anoscopy today due to tight internal sphincter and discomfort that precludes exam. He does seem to have some fullness internally that could be related to internal hemorrhoids, but I discussed with him a flex sig as it has been since 2018 that he has had evaluation. Prolapsing tissue most likely internal hemorrhoid related. 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 Will trial ketoconazole.    PLAN   Ketoconazole cream perirectally  Flex sig with Dr. Eartha in the near future for direct visualization  Continue fiber, stool softener, avoidance of straining, and limit toilet time  Further recommendations to follow  Therisa MICAEL Stager, PhD, ANP-BC [facility] Gastroenterology   I have reviewed the  note and agree with the APP's assessment as described in this progress note  Toribio Fortune, MD Gastroenterology and Hepatology [facility] Gastroenterology

## 2023-10-22 NOTE — Telephone Encounter (Signed)
 Availity PA:  Procedure Code 1 54669  Quantity 1 Units  Procedure From - To Date 2023-10-23  Status NO AUTH REQUIRED

## 2023-10-22 NOTE — Patient Instructions (Signed)
 We are arranging a flexible sigmoidoscopy with Dr. Castaneda as soon as possible.  In the meantime, continue fiber and the stool softener. Sit on the toilet for no more than 2-3 minutes at a time and avoid straining.  I am sorry I couldn't fix anything today, but it will be good to see inside what is going on!  I have also sent in some cream to use around your rectum for any irritation.   Further recommendations to follow!  Please message me on MyChart with any concerns or updates.  It was a pleasure to see you today. I want to create trusting relationships with patients and provide genuine, compassionate, and quality care. I truly value your feedback, so please be on the lookout for a survey regarding your visit with me today. I appreciate your time in completing this!         Therisa MICAEL Stager, PhD, ANP-BC [facility] Gastroenterology

## 2023-10-23 ENCOUNTER — Ambulatory Visit (HOSPITAL_COMMUNITY)
Admission: RE | Admit: 2023-10-23 | Discharge: 2023-10-23 | Disposition: A | Discharge status: Home / Self Care | Attending: Gastroenterology | Admitting: Gastroenterology

## 2023-10-23 ENCOUNTER — Encounter (HOSPITAL_COMMUNITY): Payer: Self-pay | Admitting: Gastroenterology

## 2023-10-23 ENCOUNTER — Ambulatory Visit (HOSPITAL_COMMUNITY): Admitting: Anesthesiology

## 2023-10-23 ENCOUNTER — Other Ambulatory Visit: Payer: Self-pay

## 2023-10-23 ENCOUNTER — Encounter (HOSPITAL_COMMUNITY)
Admission: RE | Disposition: A | Discharge status: Home / Self Care | Payer: Self-pay | Source: Home / Self Care | Attending: Gastroenterology

## 2023-10-23 DIAGNOSIS — K644 Residual hemorrhoidal skin tags: Secondary | ICD-10-CM | POA: Diagnosis not present

## 2023-10-23 DIAGNOSIS — Z8719 Personal history of other diseases of the digestive system: Secondary | ICD-10-CM | POA: Diagnosis not present

## 2023-10-23 DIAGNOSIS — K648 Other hemorrhoids: Secondary | ICD-10-CM | POA: Diagnosis not present

## 2023-10-23 DIAGNOSIS — K6289 Other specified diseases of anus and rectum: Secondary | ICD-10-CM | POA: Diagnosis not present

## 2023-10-23 DIAGNOSIS — K5140 Inflammatory polyps of colon without complications: Secondary | ICD-10-CM | POA: Insufficient documentation

## 2023-10-23 DIAGNOSIS — E039 Hypothyroidism, unspecified: Secondary | ICD-10-CM

## 2023-10-23 DIAGNOSIS — I10 Essential (primary) hypertension: Secondary | ICD-10-CM | POA: Diagnosis not present

## 2023-10-23 DIAGNOSIS — K635 Polyp of colon: Secondary | ICD-10-CM | POA: Diagnosis not present

## 2023-10-23 HISTORY — PX: FLEXIBLE SIGMOIDOSCOPY: SHX5431

## 2023-10-23 SURGERY — SIGMOIDOSCOPY, FLEXIBLE
Anesthesia: General

## 2023-10-23 MED ORDER — LACTATED RINGERS IV SOLN: INTRAVENOUS | Status: DC | PRN

## 2023-10-23 MED ORDER — PROPOFOL 10 MG/ML IV BOLUS
INTRAVENOUS | Status: DC | PRN
  Administered 2023-10-23: 125 ug/kg/min via INTRAVENOUS
  Administered 2023-10-23: 100 mg via INTRAVENOUS

## 2023-10-23 MED ORDER — HYDROCORTISONE (PERIANAL) 2.5 % EX CREA: 1.0000 | TOPICAL_CREAM | Freq: Two times a day (BID) | CUTANEOUS | 0 refills | Status: DC

## 2023-10-23 MED ORDER — LACTATED RINGERS IV SOLN: INTRAVENOUS | Status: DC

## 2023-10-23 MED ORDER — LIDOCAINE HCL (CARDIAC) PF 100 MG/5ML IV SOSY
PREFILLED_SYRINGE | INTRAVENOUS | Status: DC | PRN
  Administered 2023-10-23: 50 mg via INTRAVENOUS

## 2023-10-23 MED ORDER — HYDROCORTISONE ACETATE 25 MG RE SUPP: 25.0000 mg | Freq: Two times a day (BID) | RECTAL | 0 refills | Status: DC

## 2023-10-23 NOTE — Interval H&P Note (Signed)
 History and Physical Interval Note:  10/23/2023 1:44 PM  Brian Guzman  has presented today for surgery, with the diagnosis of rectal discomfort.  The various methods of treatment have been discussed with the patient and family. After consideration of risks, benefits and other options for treatment, the patient has consented to  Procedure(s) with comments: SIGMOIDOSCOPY, FLEXIBLE (N/A) - 2:00 pm, asa 2 as a surgical intervention.  The patient's history has been reviewed, patient examined, no change in status, stable for surgery.  I have reviewed the patient's chart and labs.  Questions were answered to the patient's satisfaction.     Brian Guzman

## 2023-10-23 NOTE — Anesthesia Preprocedure Evaluation (Signed)
 Anesthesia Evaluation  Patient identified by MRN, date of birth, ID band Patient awake    Reviewed: Allergy & Precautions, H&P , NPO status , Patient's Chart, lab work & pertinent test results, reviewed documented beta blocker date and time   Airway Mallampati: II  TM Distance: >3 FB Neck ROM: full    Dental no notable dental hx.    Pulmonary neg pulmonary ROS   Pulmonary exam normal breath sounds clear to auscultation       Cardiovascular Exercise Tolerance: Good hypertension,  Rhythm:regular Rate:Normal     Neuro/Psych negative neurological ROS  negative psych ROS   GI/Hepatic negative GI ROS, Neg liver ROS,,,  Endo/Other  Hypothyroidism    Renal/GU negative Renal ROS  negative genitourinary   Musculoskeletal   Abdominal   Peds  Hematology negative hematology ROS (+)   Anesthesia Other Findings   Reproductive/Obstetrics negative OB ROS                              Anesthesia Physical Anesthesia Plan  ASA: 2  Anesthesia Plan: General   Post-op Pain Management:    Induction:   PONV Risk Score and Plan: Propofol  infusion  Airway Management Planned:   Additional Equipment:   Intra-op Plan:   Post-operative Plan:   Informed Consent: I have reviewed the patients History and Physical, chart, labs and discussed the procedure including the risks, benefits and alternatives for the proposed anesthesia with the patient or authorized representative who has indicated his/her understanding and acceptance.     Dental Advisory Given  Plan Discussed with: CRNA  Anesthesia Plan Comments:         Anesthesia Quick Evaluation

## 2023-10-23 NOTE — Transfer of Care (Signed)
 Immediate Anesthesia Transfer of Care Note  Patient: Brian Guzman  Procedure(s) Performed: KINGSTON SIDE  Patient Location: Endoscopy Unit  Anesthesia Type:General  Level of Consciousness: awake, alert , oriented, and patient cooperative  Airway & Oxygen Therapy: Patient Spontanous Breathing  Post-op Assessment: Report given to RN and Post -op Vital signs reviewed and stable  Post vital signs: Reviewed and stable  Last Vitals:  Vitals Value Taken Time  BP 106/75 10/23/23 14:03  Temp 36.8 C 10/23/23 14:03  Pulse 98 10/23/23 14:03  Resp 21 10/23/23 14:03  SpO2 95 % 10/23/23 14:03    Last Pain:  Vitals:   10/23/23 1403  TempSrc: Oral  PainSc: 0-No pain      Patients Stated Pain Goal: 5 (10/23/23 1238)  Complications: No notable events documented.

## 2023-10-23 NOTE — Discharge Instructions (Signed)
 You are being discharged to home.  Resume your previous diet.  We are waiting for your pathology results.  Use Anusol  suppositories every 12 hours for 7 days Anusol  cream apply externally for 10 days twice a day.

## 2023-10-23 NOTE — Op Note (Signed)
 [facility] Patient Name: Brian Guzman Procedure Date: 10/23/2023 1:43 PM MRN: [id number] Date of Birth: 30-May-1965 Attending MD: Toribio Fortune , , [id number] CSN: [id number] Age: 58 Admit Type: Outpatient Procedure:                Flexible Sigmoidoscopy Indications:              Rectal pain Providers:                Toribio Fortune, Charlena Edison, RN, Madelin Hunter, RN Referring MD:              Medicines:                Monitored Anesthesia Care Complications:            No immediate complications. Estimated Blood Loss:     Estimated blood loss: none. Procedure:                Pre-Anesthesia Assessment:                           - Prior to the procedure, a History and Physical                            was performed, and patient medications, allergies                            and sensitivities were reviewed. The patient's                            tolerance of previous anesthesia was reviewed.                           - The risks and benefits of the procedure and the                            sedation options and risks were discussed with the                            patient. All questions were answered and informed                            consent was obtained.                           - ASA Grade Assessment: II - A patient with mild                            systemic disease.                           After obtaining informed consent, the scope was                            passed under direct vision. The PCF-[id number]                            ([id number]) Peds Colon was introduced  through the                            anus and advanced to the the sigmoid colon. The                            flexible sigmoidoscopy was accomplished without                            difficulty. The patient tolerated the procedure                            well. The quality of the bowel preparation was                            excellent. Scope In: 1:54:10 PM Scope Out:  1:59:54 PM Total Procedure Duration: 0 hours 5 minutes 44 seconds  Findings:      Hemorrhoids were found on perianal exam.      A 4 mm polyp was found in the sigmoid colon. The polyp was sessile. The       polyp was removed with a cold snare. Resection and retrieval were       complete.      Non-bleeding external and internal hemorrhoids were found during       retroflexion and during perianal exam. The hemorrhoids were small. No       irritation or inflammation was found. Impression:               - Hemorrhoids found on perianal exam.                           - One 4 mm polyp in the sigmoid colon, removed with                            a cold snare. Resected and retrieved.                           - Non-bleeding external and internal hemorrhoids. Moderate Sedation:      Per Anesthesia Care Recommendation:           - Discharge patient to home (ambulatory).                           - Resume previous diet.                           - Await pathology results.                           - Use Anusol  suppositories every 12 hours for 7 days                           - Anusol  cream apply externally for 10 days twice a                            day. Procedure Code(s):        ---  Professional ---                           [phone], Sigmoidoscopy, flexible; with removal of                            tumor(s), polyp(s), or other lesion(s) by snare                            technique Diagnosis Code(s):        --- Professional ---                           D12.5, Benign neoplasm of sigmoid colon                           K64.8, Other hemorrhoids                           K62.89, Other specified diseases of anus and rectum CPT copyright 2022 American Medical Association. All rights reserved. The codes documented in this report are preliminary and upon coder review may  be revised to meet current compliance requirements. Toribio Fortune, MD Toribio Fortune,  10/23/2023 2:12:16 PM This  report has been signed electronically. Number of Addenda: 0

## 2023-10-24 ENCOUNTER — Encounter (HOSPITAL_COMMUNITY): Payer: Self-pay | Admitting: Gastroenterology

## 2023-10-24 ENCOUNTER — Ambulatory Visit (INDEPENDENT_AMBULATORY_CARE_PROVIDER_SITE_OTHER): Payer: Self-pay | Admitting: Gastroenterology

## 2023-10-24 LAB — SURGICAL PATHOLOGY

## 2023-10-25 NOTE — Progress Notes (Signed)
 Patient result letter mailed Patient's PCP is on EPIC  TCS noted for 2028

## 2023-10-25 NOTE — Anesthesia Postprocedure Evaluation (Signed)
 Anesthesia Post Note  Patient: Brian Guzman  Procedure(s) Performed: KINGSTON SIDE  Patient location during evaluation: Phase II Anesthesia Type: General Level of consciousness: awake Pain management: pain level controlled Vital Signs Assessment: post-procedure vital signs reviewed and stable Respiratory status: spontaneous breathing and respiratory function stable Cardiovascular status: blood pressure returned to baseline and stable Postop Assessment: no headache and no apparent nausea or vomiting Anesthetic complications: no Comments: Late entry   No notable events documented.   Last Vitals:  Vitals:   10/23/23 1238 10/23/23 1403  BP: 136/89 106/75  Pulse: 98 98  Resp: 14 (!) 21  Temp: 36.8 C 36.8 C  SpO2: 96% 95%    Last Pain:  Vitals:   10/23/23 1403  TempSrc: Oral  PainSc: 0-No pain                 Yvonna JINNY Bosworth

## 2023-11-18 ENCOUNTER — Other Ambulatory Visit: Payer: Self-pay

## 2023-11-18 DIAGNOSIS — E291 Testicular hypofunction: Secondary | ICD-10-CM

## 2023-11-18 NOTE — Telephone Encounter (Signed)
 Prescription Request  11/18/2023  LOV: 09/10/23  What is the name of the medication or equipment? minoxidil  (LONITEN ) 2.5 MG tablet [[id number]]   Have you contacted your pharmacy to request a refill? Yes   Which pharmacy would you like this sent to?  [id number] - [place], [place] - [address] AT [facility] [address] [place] [address] Phone: [phone] Fax: [phone]    Patient notified that their request is being sent to the clinical staff for review and that they should receive a response within 2 business days.   Please advise at [facility] [phone]

## 2023-11-20 NOTE — Telephone Encounter (Signed)
 Requested medication (s) are due for refill today: {Yes  Requested medication (s) are on the active medication list: yes  Last refill:  09/10/23  Future visit scheduled: yes  Notes to clinic:  Unable to refill per protocol, cannot delegate.      Requested Prescriptions  Pending Prescriptions Disp Refills   minoxidil  (LONITEN ) 2.5 MG tablet 30 tablet 3    Sig: Take 1 tablet (2.5 mg total) by mouth every evening.     Not Delegated - Cardiovascular: Vasodilators - minoxidil  Failed - 11/20/2023  9:28 AM      Failed - This refill cannot be delegated      Passed - Cl in normal range and within 360 days    Chloride  Date Value Ref Range Status  09/06/2023 101 98 - 110 mmol/L Final         Passed - K in normal range and within 360 days    Potassium  Date Value Ref Range Status  09/06/2023 4.3 3.5 - 5.3 mmol/L Final         Passed - Na in normal range and within 360 days    Sodium  Date Value Ref Range Status  09/06/2023 139 135 - 146 mmol/L Final         Passed - Last BP in normal range    BP Readings from Last 1 Encounters:  10/23/23 106/75         Passed - Last Heart Rate in normal range    Pulse Readings from Last 1 Encounters:  10/23/23 98         Passed - Valid encounter within last 3 months    Recent Outpatient Visits           2 months ago Anal fissure   [place] [facility] Pickard, Butler DASEN, MD   1 year ago General medical exam   [facility] Duanne Butler DASEN, MD   2 years ago Physical exam, annual   [place] [facility] Pickard, Butler DASEN, MD              Passed - Weight completed in the last 12 months    Wt Readings from Last 1 Encounters:  10/22/23 196 lb 6.4 oz (89.1 kg)

## 2023-11-22 MED ORDER — MINOXIDIL 2.5 MG PO TABS: 2.5000 mg | ORAL_TABLET | Freq: Every evening | ORAL | 3 refills | Status: AC

## 2023-12-30 ENCOUNTER — Other Ambulatory Visit: Payer: Self-pay

## 2023-12-30 ENCOUNTER — Other Ambulatory Visit (INDEPENDENT_AMBULATORY_CARE_PROVIDER_SITE_OTHER): Payer: Self-pay | Admitting: Gastroenterology

## 2023-12-30 DIAGNOSIS — K6289 Other specified diseases of anus and rectum: Secondary | ICD-10-CM

## 2023-12-30 MED ORDER — HYDROCORTISONE (PERIANAL) 2.5 % EX CREA: 1.0000 | TOPICAL_CREAM | Freq: Two times a day (BID) | CUTANEOUS | 0 refills | Status: AC

## 2023-12-30 MED ORDER — HYDROCORTISONE ACETATE 25 MG RE SUPP: 25.0000 mg | Freq: Two times a day (BID) | RECTAL | 1 refills | Status: AC

## 2024-09-04 ENCOUNTER — Other Ambulatory Visit

## 2024-09-10 ENCOUNTER — Encounter: Admitting: Family Medicine
